# Patient Record
Sex: Male | Born: 1955 | Hispanic: No | Marital: Married | Smoking: Current every day smoker
Health system: Southern US, Community
[De-identification: ages and names within clinical notes are randomized; demographics above are authoritative.]

## PROBLEM LIST (undated history)

## (undated) DIAGNOSIS — M1990 Unspecified osteoarthritis, unspecified site: Secondary | ICD-10-CM

## (undated) DIAGNOSIS — Z5189 Encounter for other specified aftercare: Secondary | ICD-10-CM

## (undated) DIAGNOSIS — F32A Depression, unspecified: Secondary | ICD-10-CM

## (undated) DIAGNOSIS — D693 Immune thrombocytopenic purpura: Secondary | ICD-10-CM

## (undated) DIAGNOSIS — J45909 Unspecified asthma, uncomplicated: Secondary | ICD-10-CM

## (undated) DIAGNOSIS — F329 Major depressive disorder, single episode, unspecified: Secondary | ICD-10-CM

## (undated) DIAGNOSIS — IMO0002 Reserved for concepts with insufficient information to code with codable children: Secondary | ICD-10-CM

## (undated) DIAGNOSIS — I10 Essential (primary) hypertension: Secondary | ICD-10-CM

## (undated) DIAGNOSIS — E785 Hyperlipidemia, unspecified: Secondary | ICD-10-CM

## (undated) DIAGNOSIS — M797 Fibromyalgia: Secondary | ICD-10-CM

## (undated) DIAGNOSIS — K219 Gastro-esophageal reflux disease without esophagitis: Secondary | ICD-10-CM

## (undated) DIAGNOSIS — M329 Systemic lupus erythematosus, unspecified: Secondary | ICD-10-CM

## (undated) DIAGNOSIS — F419 Anxiety disorder, unspecified: Secondary | ICD-10-CM

## (undated) DIAGNOSIS — F909 Attention-deficit hyperactivity disorder, unspecified type: Secondary | ICD-10-CM

## (undated) HISTORY — DX: Depression, unspecified: F32.A

## (undated) HISTORY — DX: Gastro-esophageal reflux disease without esophagitis: K21.9

## (undated) HISTORY — PX: OTHER SURGICAL HISTORY: SHX169

## (undated) HISTORY — DX: Unspecified asthma, uncomplicated: J45.909

## (undated) HISTORY — DX: Hyperlipidemia, unspecified: E78.5

## (undated) HISTORY — DX: Anxiety disorder, unspecified: F41.9

## (undated) HISTORY — DX: Major depressive disorder, single episode, unspecified: F32.9

## (undated) HISTORY — DX: Encounter for other specified aftercare: Z51.89

---

## 1998-07-31 HISTORY — PX: COLONOSCOPY: SHX174

## 2013-09-21 ENCOUNTER — Emergency Department (HOSPITAL_COMMUNITY): Payer: Medicaid Other

## 2013-09-21 ENCOUNTER — Emergency Department (HOSPITAL_COMMUNITY)
Admission: EM | Admit: 2013-09-21 | Discharge: 2013-09-21 | Disposition: A | Discharge status: Home / Self Care | Payer: Medicaid Other | Attending: Emergency Medicine | Admitting: Emergency Medicine

## 2013-09-21 ENCOUNTER — Encounter (HOSPITAL_COMMUNITY): Payer: Self-pay | Admitting: Emergency Medicine

## 2013-09-21 DIAGNOSIS — Z862 Personal history of diseases of the blood and blood-forming organs and certain disorders involving the immune mechanism: Secondary | ICD-10-CM | POA: Insufficient documentation

## 2013-09-21 DIAGNOSIS — R042 Hemoptysis: Secondary | ICD-10-CM | POA: Insufficient documentation

## 2013-09-21 DIAGNOSIS — F17200 Nicotine dependence, unspecified, uncomplicated: Secondary | ICD-10-CM | POA: Insufficient documentation

## 2013-09-21 DIAGNOSIS — Z79899 Other long term (current) drug therapy: Secondary | ICD-10-CM | POA: Insufficient documentation

## 2013-09-21 DIAGNOSIS — I10 Essential (primary) hypertension: Secondary | ICD-10-CM | POA: Insufficient documentation

## 2013-09-21 DIAGNOSIS — M129 Arthropathy, unspecified: Secondary | ICD-10-CM | POA: Insufficient documentation

## 2013-09-21 DIAGNOSIS — IMO0002 Reserved for concepts with insufficient information to code with codable children: Secondary | ICD-10-CM | POA: Insufficient documentation

## 2013-09-21 DIAGNOSIS — M329 Systemic lupus erythematosus, unspecified: Secondary | ICD-10-CM | POA: Insufficient documentation

## 2013-09-21 DIAGNOSIS — Z7189 Other specified counseling: Secondary | ICD-10-CM | POA: Insufficient documentation

## 2013-09-21 DIAGNOSIS — F909 Attention-deficit hyperactivity disorder, unspecified type: Secondary | ICD-10-CM | POA: Insufficient documentation

## 2013-09-21 DIAGNOSIS — Y9289 Other specified places as the place of occurrence of the external cause: Secondary | ICD-10-CM | POA: Insufficient documentation

## 2013-09-21 DIAGNOSIS — W010XXA Fall on same level from slipping, tripping and stumbling without subsequent striking against object, initial encounter: Secondary | ICD-10-CM | POA: Insufficient documentation

## 2013-09-21 DIAGNOSIS — W19XXXA Unspecified fall, initial encounter: Secondary | ICD-10-CM

## 2013-09-21 DIAGNOSIS — Y9389 Activity, other specified: Secondary | ICD-10-CM | POA: Insufficient documentation

## 2013-09-21 HISTORY — DX: Systemic lupus erythematosus, unspecified: M32.9

## 2013-09-21 HISTORY — DX: Immune thrombocytopenic purpura: D69.3

## 2013-09-21 HISTORY — DX: Fibromyalgia: M79.7

## 2013-09-21 HISTORY — DX: Attention-deficit hyperactivity disorder, unspecified type: F90.9

## 2013-09-21 HISTORY — DX: Essential (primary) hypertension: I10

## 2013-09-21 HISTORY — DX: Unspecified osteoarthritis, unspecified site: M19.90

## 2013-09-21 HISTORY — DX: Reserved for concepts with insufficient information to code with codable children: IMO0002

## 2013-09-21 LAB — CBC WITH DIFFERENTIAL/PLATELET
Basophils Absolute: 0.1 10*3/uL (ref 0.0–0.1)
Basophils Relative: 1 % (ref 0–1)
EOS ABS: 0.3 10*3/uL (ref 0.0–0.7)
Eosinophils Relative: 2 % (ref 0–5)
HCT: 43.2 % (ref 39.0–52.0)
Hemoglobin: 15.4 g/dL (ref 13.0–17.0)
LYMPHS ABS: 1.7 10*3/uL (ref 0.7–4.0)
Lymphocytes Relative: 12 % (ref 12–46)
MCH: 29.9 pg (ref 26.0–34.0)
MCHC: 35.6 g/dL (ref 30.0–36.0)
MCV: 83.9 fL (ref 78.0–100.0)
MONO ABS: 1.5 10*3/uL — AB (ref 0.1–1.0)
MONOS PCT: 10 % (ref 3–12)
Neutro Abs: 10.5 10*3/uL — ABNORMAL HIGH (ref 1.7–7.7)
Neutrophils Relative %: 75 % (ref 43–77)
PLATELETS: 311 10*3/uL (ref 150–400)
RBC: 5.15 MIL/uL (ref 4.22–5.81)
RDW: 13.5 % (ref 11.5–15.5)
WBC: 14 10*3/uL — ABNORMAL HIGH (ref 4.0–10.5)

## 2013-09-21 MED ORDER — HYDROCODONE-ACETAMINOPHEN 5-325 MG PO TABS
2.0000 | ORAL_TABLET | Freq: Once | ORAL | Status: AC
  Administered 2013-09-21: 2 via ORAL
  Filled 2013-09-21: qty 2

## 2013-09-21 NOTE — ED Notes (Signed)
Patient transported to CT 

## 2013-09-21 NOTE — ED Notes (Signed)
Pt states he fell three times on the ice landing on his back  Pt states he has lupus and other problems  Pt states he is having pain in his back, groin, and down into his leg  Pt states for the past 2 days he has been coughing up blood  Pt states it is "clumps of blood"  Pt states the pain he is having is different than his normal pain

## 2013-09-21 NOTE — ED Provider Notes (Signed)
CSN: [id number]     Arrival date & time 09/21/13  1913 History   First MD Initiated Contact with Patient 09/21/13 2048     Chief Complaint  Patient presents with  . Fall  . coughing up blood   . Back Pain  . Groin Pain     (Consider location/radiation/quality/duration/timing/severity/associated sxs/prior Treatment) HPI Slipped and fell on ice 2 days ago. He reports having fallen 3 times. Landing on his buttocks. He complains of low back pain radiating to right mid thigh and right groin since event. He also reports coughing up green mucus with slight flecks of blood onset 2 days ago he denies shortness of breath. Denies chest pain. No abdominal pain. Pain is moderate. Not made better or worse by anything. Past Medical History  Diagnosis Date  . Lupus   . ITP (idiopathic thrombocytopenic purpura)   . Hypertension   . Fibromyalgia   . ADHD (attention deficit hyperactivity disorder)   . Arthritis    History reviewed. No pertinent past surgical history. Family History  Problem Relation Age of Onset  . Hypertension Other   . Diabetes Other   . Cancer Other   . CAD Other    History  Substance Use Topics  . Smoking status: Current Every Day Smoker    Types: Cigarettes  . Smokeless tobacco: Not on file  . Alcohol Use: No    Review of Systems  Constitutional: Negative.   HENT: Negative.   Respiratory: Positive for cough.        Hemoptysis  Cardiovascular: Negative.   Gastrointestinal: Negative.   Musculoskeletal: Positive for back pain.  Skin: Negative.   Neurological: Negative.   Psychiatric/Behavioral: Negative.   All other systems reviewed and are negative.      Allergies  Dilaudid  Home Medications   Current Outpatient Rx  Name  Route  Sig  Dispense  Refill  . albuterol (PROVENTIL HFA;VENTOLIN HFA) 108 (90 BASE) MCG/ACT inhaler   Inhalation   Inhale 2 puffs into the lungs 3 (three) times daily.         Marland Kitchen amLODipine (NORVASC) 10 MG tablet   Oral   Take  10 mg by mouth daily.         Marland Kitchen amphetamine-dextroamphetamine (ADDERALL) 15 MG tablet   Oral   Take 15 mg by mouth 3 (three) times daily.         . beclomethasone (QVAR) 40 MCG/ACT inhaler   Inhalation   Inhale 1 puff into the lungs 2 (two) times daily.         . busPIRone (BUSPAR) 15 MG tablet   Oral   Take 15 mg by mouth 2 (two) times daily.         . cloNIDine (CATAPRES) 0.1 MG tablet   Oral   Take 0.1 mg by mouth 3 (three) times daily.         . hydrochlorothiazide (MICROZIDE) 12.5 MG capsule   Oral   Take 12.525 mg by mouth daily.         Marland Kitchen HYDROcodone-acetaminophen (NORCO/VICODIN) 5-325 MG per tablet   Oral   Take 1 tablet by mouth every 6 (six) hours as needed for moderate pain (pain).         Marland Kitchen levothyroxine (SYNTHROID, LEVOTHROID) 50 MCG tablet   Oral   Take 50 mcg by mouth at bedtime.         . lovastatin (MEVACOR) 20 MG tablet   Oral   Take 20 mg by mouth  at bedtime.         . meloxicam (MOBIC) 15 MG tablet   Oral   Take 15 mg by mouth daily.         . montelukast (SINGULAIR) 10 MG tablet   Oral   Take 10 mg by mouth at bedtime.          BP 163/97  Pulse 110  Temp(Src) 98.2 F (36.8 C) (Oral)  Resp 19  Ht 5\' 11"  (1.803 m)  Wt 202 lb (91.627 kg)  BMI 28.19 kg/m2  SpO2 100% Physical Exam  Nursing note and vitals reviewed. Constitutional: He is oriented to person, place, and time. He appears well-developed and well-nourished.  HENT:  Head: Normocephalic and atraumatic.  Eyes: Conjunctivae are normal. Pupils are equal, round, and reactive to light.  Neck: Neck supple. No tracheal deviation present. No thyromegaly present.  Cardiovascular: Normal rate and regular rhythm.   No murmur heard. Pulmonary/Chest: Effort normal and breath sounds normal.  Abdominal: Soft. Bowel sounds are normal. He exhibits no distension. There is no tenderness.  Musculoskeletal: Normal range of motion. He exhibits no edema and no tenderness.   Minimally tender over lumbar spine thoracic spine and cervical spine nontender pelvis stable   Neurological: He is alert and oriented to person, place, and time. Coordination normal.  Gait normal  Skin: Skin is warm and dry. No rash noted.  Psychiatric: He has a normal mood and affect.    ED Course  Procedures (including critical care time) Labs Review Labs Reviewed  CBC WITH DIFFERENTIAL - Abnormal; Notable for the following:    WBC 14.0 (*)    Neutro Abs 10.5 (*)    Monocytes Absolute 1.5 (*)    All other components within normal limits   Imaging Review Dg Chest 2 View (if Patient Has Fever And/or Copd)  09/21/2013   CLINICAL DATA:  Status post fall; hemoptysis. Lower back pain and groin pain. History of smoking.  EXAM: CHEST  2 VIEW  COMPARISON:  None.  FINDINGS: The lungs are well-aerated and clear. There is no evidence of focal opacification, pleural effusion or pneumothorax.  The heart is normal in size; the mediastinal contour is within normal limits. No acute osseous abnormalities are seen.  IMPRESSION: No acute cardiopulmonary process seen.   Electronically Signed   By: Roanna Raider M.D.   On: 09/21/2013 21:24    EKG Interpretation   None      All x-rays viewed by me. Immediately prior to discharge Patient felt improved after treatment with Norco. Results for orders placed during the hospital encounter of 09/21/13  CBC WITH DIFFERENTIAL      Result Value Ref Range   WBC 14.0 (*) 4.0 - 10.5 K/uL   RBC 5.15  4.22 - 5.81 MIL/uL   Hemoglobin 15.4  13.0 - 17.0 g/dL   HCT 78.2  95.6 - 21.3 %   MCV 83.9  78.0 - 100.0 fL   MCH 29.9  26.0 - 34.0 pg   MCHC 35.6  30.0 - 36.0 g/dL   RDW 08.6  57.8 - 46.9 %   Platelets 311  150 - 400 K/uL   Neutrophils Relative % 75  43 - 77 %   Neutro Abs 10.5 (*) 1.7 - 7.7 K/uL   Lymphocytes Relative 12  12 - 46 %   Lymphs Abs 1.7  0.7 - 4.0 K/uL   Monocytes Relative 10  3 - 12 %   Monocytes Absolute 1.5 (*) 0.1 -  1.0 K/uL    Eosinophils Relative 2  0 - 5 %   Eosinophils Absolute 0.3  0.0 - 0.7 K/uL   Basophils Relative 1  0 - 1 %   Basophils Absolute 0.1  0.0 - 0.1 K/uL   Dg Chest 2 View (if Patient Has Fever And/or Copd)  09/21/2013   CLINICAL DATA:  Status post fall; hemoptysis. Lower back pain and groin pain. History of smoking.  EXAM: CHEST  2 VIEW  COMPARISON:  None.  FINDINGS: The lungs are well-aerated and clear. There is no evidence of focal opacification, pleural effusion or pneumothorax.  The heart is normal in size; the mediastinal contour is within normal limits. No acute osseous abnormalities are seen.  IMPRESSION: No acute cardiopulmonary process seen.   Electronically Signed   By: Roanna RaiderJeffery  Chang M.D.   On: 09/21/2013 21:24   Dg Lumbar Spine Complete  09/21/2013   CLINICAL DATA:  Status post fall; lower back pain.  EXAM: LUMBAR SPINE - COMPLETE 4+ VIEW  COMPARISON:  None.  FINDINGS: There is no evidence of fracture or subluxation. Vertebral bodies demonstrate normal height and alignment. Intervertebral disc spaces are preserved. The visualized neural foramina are grossly unremarkable in appearance.  The visualized bowel gas pattern is unremarkable in appearance; air and stool are noted within the colon. The sacroiliac joints are within normal limits.  IMPRESSION: No evidence of fracture or subluxation along the lumbar spine.   Electronically Signed   By: Roanna RaiderJeffery  Chang M.D.   On: 09/21/2013 22:24   Dg Pelvis 1-2 Views  09/21/2013   CLINICAL DATA:  Status post fall; pelvic pain.  EXAM: PELVIS - 1-2 VIEW  COMPARISON:  None.  FINDINGS: There is no evidence of fracture or dislocation. Both femoral heads are seated normally within their respective acetabula. No significant degenerative change is appreciated. The sacroiliac joints are unremarkable in appearance.  The visualized bowel gas pattern is grossly unremarkable in appearance.  IMPRESSION: No evidence of fracture or dislocation.   Electronically Signed   By:  Roanna RaiderJeffery  Chang M.D.   On: 09/21/2013 22:17    MDM   Final diagnoses:  None   patient is under a pain contract with his primary care physician. I instructed him to call Dr.Hasan to adjust his pain medication regimen as needed. I spent 5 minutes counseling patient on smoking cessation. Patient felt to have bronchitis. No inhalers prescribed she denies shortness of breath and had clear lung exam. Diagnosis #1 fall #2 low back pain #3 bronchitis #4 tobacco abuse      Doug SouSam Kalani Baray, MD 09/21/13 2340

## 2013-12-23 ENCOUNTER — Emergency Department (HOSPITAL_COMMUNITY)
Admission: EM | Admit: 2013-12-23 | Discharge: 2013-12-23 | Disposition: A | Discharge status: Home / Self Care | Payer: Medicaid Other | Attending: Emergency Medicine | Admitting: Emergency Medicine

## 2013-12-23 ENCOUNTER — Encounter (HOSPITAL_COMMUNITY): Payer: Self-pay | Admitting: Emergency Medicine

## 2013-12-23 DIAGNOSIS — Z862 Personal history of diseases of the blood and blood-forming organs and certain disorders involving the immune mechanism: Secondary | ICD-10-CM | POA: Insufficient documentation

## 2013-12-23 DIAGNOSIS — Z79899 Other long term (current) drug therapy: Secondary | ICD-10-CM | POA: Insufficient documentation

## 2013-12-23 DIAGNOSIS — R21 Rash and other nonspecific skin eruption: Secondary | ICD-10-CM | POA: Insufficient documentation

## 2013-12-23 DIAGNOSIS — M329 Systemic lupus erythematosus, unspecified: Secondary | ICD-10-CM | POA: Insufficient documentation

## 2013-12-23 DIAGNOSIS — Z791 Long term (current) use of non-steroidal anti-inflammatories (NSAID): Secondary | ICD-10-CM | POA: Insufficient documentation

## 2013-12-23 DIAGNOSIS — IMO0002 Reserved for concepts with insufficient information to code with codable children: Secondary | ICD-10-CM | POA: Insufficient documentation

## 2013-12-23 DIAGNOSIS — M549 Dorsalgia, unspecified: Secondary | ICD-10-CM | POA: Insufficient documentation

## 2013-12-23 DIAGNOSIS — I10 Essential (primary) hypertension: Secondary | ICD-10-CM | POA: Insufficient documentation

## 2013-12-23 DIAGNOSIS — F909 Attention-deficit hyperactivity disorder, unspecified type: Secondary | ICD-10-CM | POA: Insufficient documentation

## 2013-12-23 DIAGNOSIS — R233 Spontaneous ecchymoses: Secondary | ICD-10-CM | POA: Insufficient documentation

## 2013-12-23 DIAGNOSIS — F17200 Nicotine dependence, unspecified, uncomplicated: Secondary | ICD-10-CM | POA: Insufficient documentation

## 2013-12-23 DIAGNOSIS — IMO0001 Reserved for inherently not codable concepts without codable children: Secondary | ICD-10-CM | POA: Insufficient documentation

## 2013-12-23 DIAGNOSIS — M129 Arthropathy, unspecified: Secondary | ICD-10-CM | POA: Insufficient documentation

## 2013-12-23 DIAGNOSIS — G8929 Other chronic pain: Secondary | ICD-10-CM | POA: Insufficient documentation

## 2013-12-23 LAB — CBC WITH DIFFERENTIAL/PLATELET
BASOS ABS: 0 10*3/uL (ref 0.0–0.1)
Basophils Relative: 1 % (ref 0–1)
EOS ABS: 0.2 10*3/uL (ref 0.0–0.7)
EOS PCT: 4 % (ref 0–5)
HCT: 37 % — ABNORMAL LOW (ref 39.0–52.0)
Hemoglobin: 13.3 g/dL (ref 13.0–17.0)
Lymphocytes Relative: 17 % (ref 12–46)
Lymphs Abs: 1 10*3/uL (ref 0.7–4.0)
MCH: 30.5 pg (ref 26.0–34.0)
MCHC: 35.9 g/dL (ref 30.0–36.0)
MCV: 84.9 fL (ref 78.0–100.0)
Monocytes Absolute: 0.7 10*3/uL (ref 0.1–1.0)
Monocytes Relative: 11 % (ref 3–12)
Neutro Abs: 4 10*3/uL (ref 1.7–7.7)
Neutrophils Relative %: 67 % (ref 43–77)
Platelets: 255 10*3/uL (ref 150–400)
RBC: 4.36 MIL/uL (ref 4.22–5.81)
RDW: 14.4 % (ref 11.5–15.5)
WBC: 6 10*3/uL (ref 4.0–10.5)

## 2013-12-23 LAB — DIC (DISSEMINATED INTRAVASCULAR COAGULATION) PANEL
APTT: 27 s (ref 24–37)
FIBRINOGEN: 277 mg/dL (ref 204–475)
INR: 0.97 (ref 0.00–1.49)
PLATELETS: 271 10*3/uL (ref 150–400)
SMEAR REVIEW: NONE SEEN

## 2013-12-23 LAB — COMPREHENSIVE METABOLIC PANEL
ALT: 44 U/L (ref 0–53)
AST: 28 U/L (ref 0–37)
Albumin: 4 g/dL (ref 3.5–5.2)
Alkaline Phosphatase: 63 U/L (ref 39–117)
BUN: 22 mg/dL (ref 6–23)
CALCIUM: 9 mg/dL (ref 8.4–10.5)
CO2: 20 mEq/L (ref 19–32)
Chloride: 105 mEq/L (ref 96–112)
Creatinine, Ser: 0.73 mg/dL (ref 0.50–1.35)
GFR calc Af Amer: >90 mL/min (ref >90)
GFR calc non Af Amer: >90 mL/min (ref >90)
Glucose, Bld: 115 mg/dL — ABNORMAL HIGH (ref 70–99)
Potassium: 3.8 mEq/L (ref 3.7–5.3)
SODIUM: 139 meq/L (ref 137–147)
TOTAL PROTEIN: 6.7 g/dL (ref 6.0–8.3)
Total Bilirubin: 0.3 mg/dL (ref 0.3–1.2)

## 2013-12-23 LAB — DIC (DISSEMINATED INTRAVASCULAR COAGULATION)PANEL
D-Dimer, Quant: <0.27 ug/mL-FEU (ref 0.00–0.48)
Prothrombin Time: 12.7 seconds (ref 11.6–15.2)

## 2013-12-23 MED ORDER — DOXYCYCLINE HYCLATE 100 MG PO CAPS: 100.0000 mg | ORAL_CAPSULE | Freq: Two times a day (BID) | ORAL | Status: DC

## 2013-12-23 MED ORDER — HYDROCODONE-ACETAMINOPHEN 5-325 MG PO TABS
2.0000 | ORAL_TABLET | Freq: Once | ORAL | Status: AC
  Administered 2013-12-23: 2 via ORAL
  Filled 2013-12-23: qty 2

## 2013-12-23 NOTE — Discharge Instructions (Signed)
There were no abnormalities on your blood work today. Your platelet count is normal and your coagulation studies are normal. We will give you doxycycline antibiotic to cover for possible [place] spotted fever which can cause a rash such as yours. Please take this medicaition as directed. Avoid direct sunlight as it can increase your chances of sunburn. Follow closely with your primary care doctor.   [place] Spotted Fever [place] Spotted Fever (RMSF) is the oldest known tick-borne disease of people in the [place]. This disease was named because it was first described among people in the [facility] [place] area who had an illness characterized by a rash with red-purple-black spots. This disease is caused by a rickettsia (Rickettsia rickettsii), a bacteria carried by the tick. [facility] wood tick and the American dog tick, acquire and transmit the RMSF bacteria (pictures NOT actual size). When a larval, nymphal or adult tick feeds on an infected rodent or larger animal, the tick can become infected. Infected adult ticks then feed on people who may then get RMSF. The tick transmits the disease to humans during a prolonged period of feeding that lasts many hours, days or even a couple weeks. The bite is painless and frequently goes unnoticed. An infected male tick may also pass the rickettsial bacteria to her eggs that then may mature to be infected adult ticks. The rickettsia that causes RMSF can also get into a person's body through damaged skin. A tick bite is not necessary. People can get RMSF if they crush a tick and get it's blood or body fluids on their skin through a small cut or sore.  DIAGNOSIS Diagnosis is made by laboratory tests.  TREATMENT Treatment is with antibiotics (medications that kill rickettsia and other bacteria). Immediate treatment usually prevents death. GEOGRAPHIC RANGE This disease was reported only in the [facility] [place] until 1931. RMSF has  more recently been described among individuals in all states except [place], [place] and [place]. The highest reported incidences of RMSF now occur among residents of [place], [place], [place] and [address] [place]. TIME OF YEAR  Most cases are diagnosed during late spring and summer when ticks are most active. However, especially in the warmer [place], a few cases occur during the winter. SYMPTOMS   Symptoms of RMSF begin from 2 to 14 days after a tick bite. The most common early symptoms are fever, muscle aches and headache followed by nausea (feeling sick to your stomach) or vomiting.  The RMSF rash is typically delayed until 3 or more days after symptom onset, and eventually develops in 9 of 10 infected patients by the 5th day of illness. If the disease is not treated it can cause death. If you get a fever, headache, muscle aches, rash, nausea or vomiting within 2 weeks of a possible tick bite or exposure you should see your caregiver immediately. PREVENTION Ticks prefer to hide in shady, moist ground litter. They can often be found above the ground clinging to tall grass, brush, shrubs and low tree branches. They also inhabit lawns and gardens, especially at the edges of woodlands and around old stone walls. Within the areas where ticks generally live, no naturally vegetated area can be considered completely free of infected ticks. The best precaution against RMSF is to avoid contact with soil, leaf litter and vegetation as much as possible in tick infested areas. For those who enjoy gardening or walking in their yards, clear brush and mow tall grass around houses and at the  edges of gardens. This may help reduce the tick population in the immediate area. Applications of chemical insecticides by a licensed professional in the spring (late May) and Fall (September) will also control ticks, especially in heavily infested areas. Treatment will never get rid of all the ticks. Getting rid of small  animal populations that host ticks will also decrease the tick population. When working in the garden, [employer], or handling soil and vegetation, wear light-colored protective clothing and gloves. Spot-check often to prevent ticks from reaching the skin. Ticks cannot jump or fly. They will not drop from an above-ground perch onto a passing animal. Once a tick gains access to human skin it climbs upward until it reaches a more protected area. For example, the back of the knee, groin, navel, armpit, ears or nape of the neck. It then begins the slow process of embedding itself in the skin. Campers, hikers, field workers, and others who spend time in wooded, brushy or tall grassy areas can avoid exposure to ticks by using the following precautions:  Wear light-colored clothing with a tight weave to spot ticks more easily and prevent contact with the skin.  Wear long pants tucked into socks, long-sleeved shirts tucked into pants and enclosed shoes or boots along with insect repellent.  Spray clothes with insect repellent containing either DEET or Permethrin. Only DEET can be used on exposed skin. Follow the manufacturer's directions carefully.  Wear a hat and keep long hair pulled back.  Stay on cleared, well-worn trails whenever possible.  Spot-check yourself and others often for the presence of ticks on clothes. If you find one, there are likely to be others. Check thoroughly.  Remove clothes after leaving tick-infested areas. If possible, wash them to eliminate any unseen ticks. Check yourself, your children and any pets from head to toe for the presence of ticks.  Shower and shampoo. You can greatly reduce your chances of contracting RMSF if you remove attached ticks as soon as possible. Regular checks of the body, including all body sites covered by hair (head, armpits, genitals), allow removal of the tick before rickettsial transmission. To remove an attached tick, use a forceps or tweezers  to detach the intact tick without leaving mouth parts in the skin. The tick bite wound should be cleansed after tick removal. Remember the most common symptoms of RMSF are fever, muscle aches, headache and nausea or vomiting with a later onset of rash. If you get these symptoms after a tick bite and while living in an area where RMSF is found, RMSF should be suspected. If the disease is not treated, it can cause death. See your caregiver immediately if you get these symptoms. Do this even if not aware of a tick bite. Document Released: 10/29/2000 Document Revised: 10/09/2011 Document Reviewed: 06/21/2009 [facility] Patient Information 2014 [place], [place].

## 2013-12-23 NOTE — ED Notes (Signed)
Pt alert, arrives from home, c/o bruising to lower legs, per family pt has hx of ITP, resp even unlabored

## 2013-12-23 NOTE — ED Provider Notes (Signed)
CSN: [id number]     Arrival date & time 12/23/13  1247 History   First MD Initiated Contact with Patient 12/23/13 1304     Chief Complaint  Patient presents with  . Bleeding/Bruising     (Consider location/radiation/quality/duration/timing/severity/associated sxs/prior Treatment) HPI  Patient presents with cc of petechiae. History provided by the patient and his wife. He states that he has a hx of lupus, chronic pain, and ITP. Patient states that he had an episode of severe ITP/Thrombocytopenia and what sound like possible DIC requiring ICU admission and multiple blood transfusions several years ago. [facility] [place]. Patient c/o petechial rash of the legs that developed yesterday and has worsened.He states that the physicians at the last admission told him to go to the ED immediately if this occurred. He also c/o abdominal bloating.He states that he had abdominal bleeding last time and was afraid it was returning. He denies abdominal pain, nausea, vomiting, or diarrhea.  He denies noticing prolonged bleeding times or other bleeding abnormalities such as bleeding gums, melena, or hematochezia.Marland Kitchen. He complains of pain all over which is chronic. He is seen by Dr. Quitman LivingsSami Hassan and is under pain contract.He denies any known tick bites or fever.   Past Medical History  Diagnosis Date  . Lupus   . ITP (idiopathic thrombocytopenic purpura)   . Hypertension   . Fibromyalgia   . ADHD (attention deficit hyperactivity disorder)   . Arthritis    History reviewed. No pertinent past surgical history. Family History  Problem Relation Age of Onset  . Hypertension Other   . Diabetes Other   . Cancer Other   . CAD Other    History  Substance Use Topics  . Smoking status: Current Every Day Smoker    Types: Cigarettes  . Smokeless tobacco: Not on file  . Alcohol Use: No    Review of Systems  Constitutional: Negative for fever and chills.  Respiratory: Negative  for cough and shortness of breath.   Cardiovascular: Negative for chest pain and palpitations.  Gastrointestinal: Negative for vomiting, abdominal pain, diarrhea and constipation.  Genitourinary: Negative for dysuria, urgency and frequency.  Musculoskeletal: Positive for arthralgias, back pain and myalgias.  Skin: Positive for rash.  Neurological: Negative for headaches.  Hematological: Negative for adenopathy. Bruises/bleeds easily.  Psychiatric/Behavioral: Positive for decreased concentration.  All other systems reviewed and are negative.     Allergies  Dilaudid and Prednisone  Home Medications   Prior to Admission medications   Medication Sig Start Date End Date Taking? Authorizing Provider  albuterol (PROVENTIL HFA;VENTOLIN HFA) 108 (90 BASE) MCG/ACT inhaler Inhale 2 puffs into the lungs 3 (three) times daily.   Yes Historical Provider, MD  amLODipine (NORVASC) 10 MG tablet Take 10 mg by mouth every morning.    Yes Historical Provider, MD  amphetamine-dextroamphetamine (ADDERALL) 15 MG tablet Take 15 mg by mouth 3 (three) times daily.   Yes Historical Provider, MD  beclomethasone (QVAR) 40 MCG/ACT inhaler Inhale 1 puff into the lungs 2 (two) times daily.   Yes Historical Provider, MD  busPIRone (BUSPAR) 15 MG tablet Take 15 mg by mouth 2 (two) times daily.   Yes Historical Provider, MD  cloNIDine (CATAPRES) 0.1 MG tablet Take 0.1 mg by mouth 3 (three) times daily.   Yes Historical Provider, MD  hydrochlorothiazide (MICROZIDE) 12.5 MG capsule Take 12.5 mg by mouth every morning.    Yes Historical Provider, MD  HYDROcodone-acetaminophen (NORCO/VICODIN) 5-325 MG per tablet Take 1  tablet by mouth every 6 (six) hours as needed for moderate pain.    Yes Historical Provider, MD  levothyroxine (SYNTHROID, LEVOTHROID) 50 MCG tablet Take 50 mcg by mouth at bedtime.   Yes Historical Provider, MD  lovastatin (MEVACOR) 20 MG tablet Take 20 mg by mouth at bedtime.   Yes Historical Provider, MD    meloxicam (MOBIC) 15 MG tablet Take 15 mg by mouth every morning.    Yes Historical Provider, MD  montelukast (SINGULAIR) 10 MG tablet Take 10 mg by mouth at bedtime.   Yes Historical Provider, MD   BP 152/91  Pulse 100  Temp(Src) 98 F (36.7 C) (Oral)  Resp 16  Wt 202 lb (91.627 kg)  SpO2 99% Physical Exam  Nursing note and vitals reviewed. Constitutional: He appears well-developed and well-nourished. No distress.  HENT:  Head: Normocephalic and atraumatic.  Mouth/Throat: Uvula is midline.    Eyes: Conjunctivae are normal. No scleral icterus.  Neck: Normal range of motion. Neck supple.  Cardiovascular: Normal rate, regular rhythm and normal heart sounds.   Pulmonary/Chest: Effort normal and breath sounds normal. No respiratory distress.  Abdominal: Soft. There is no tenderness.  Musculoskeletal: He exhibits no edema.  Neurological: He is alert.  Skin: Skin is warm and dry. He is not diaphoretic.     Psychiatric: His behavior is normal.    ED Course  Procedures (including critical care time) Labs Review Labs Reviewed  CBC WITH DIFFERENTIAL - Abnormal; Notable for the following:    HCT 37.0 (*)    All other components within normal limits  COMPREHENSIVE METABOLIC PANEL - Abnormal; Notable for the following:    Glucose, Bld 115 (*)    All other components within normal limits  DIC (DISSEMINATED INTRAVASCULAR COAGULATION) PANEL    Imaging Review No results found.   EKG Interpretation None      MDM   Final diagnoses:  Petechial rash   Concern for thrombocytopenia. DIC panel pending. Norco given for chronic pain.  Filed Vitals:   12/23/13 1300 12/23/13 1442  BP: 152/91 142/89  Pulse: 100 72  Temp: 98 F (36.7 C) 98.5 F (36.9 C)  TempSrc: Oral Oral  Resp: 16 20  Weight: 202 lb (91.627 kg)   SpO2: 99% 100%    VSS. Normal hgb. DIC panel including PT/INR, aPTT,fibrinogen and D-dimer are all wnl. No schisotcytes indicating microangiopathy  indicated.  Although he denies fever, I have discussed the case with my attending physician Dr. Gwendolyn Grant and will treat for possible RMSF with 14 days Doxy BID. Patient / Family / Caregiver informed of clinical course and work up findings to include any labs, imagine, or medical decision-making performed and agree with plan for discharge. Discussed return precautions using teach back method.   I personally reviewed the imaging tests through PACS system. I have reviewed and interpreted Lab values. I reviewed available ER/hospitalization records through the EMR    Arthor Captain, [place] 12/25/13 1040

## 2013-12-26 NOTE — ED Provider Notes (Signed)
Medical screening examination/treatment/procedure(s) were conducted as a shared visit with non-physician practitioner(s) and myself.  I personally evaluated the patient during the encounter.   EKG Interpretation None      Patient here with bruising. Hx of ITP. Mild lower leg petechiae. No thrombocytopenia. No signs of DIC. Will treat with Doxycycline for possible RMSF.  Dagmar Hait, MD 12/26/13 [phone]

## 2017-06-15 ENCOUNTER — Encounter: Payer: Self-pay | Admitting: Gastroenterology

## 2017-06-29 ENCOUNTER — Ambulatory Visit (AMBULATORY_SURGERY_CENTER): Payer: Self-pay | Admitting: *Deleted

## 2017-06-29 ENCOUNTER — Other Ambulatory Visit: Payer: Self-pay

## 2017-06-29 VITALS — Ht 71.0 in | Wt 196.0 lb

## 2017-06-29 DIAGNOSIS — Z1211 Encounter for screening for malignant neoplasm of colon: Secondary | ICD-10-CM

## 2017-06-29 MED ORDER — NA SULFATE-K SULFATE-MG SULF 17.5-3.13-1.6 GM/177ML PO SOLN: 1.0000 | Freq: Once | ORAL | 0 refills | Status: AC

## 2017-06-29 NOTE — Progress Notes (Signed)
No egg or soy allergy known to patient  No issues with past sedation with any surgeries  or procedures, no intubation problems  No diet pills per patient No home 02 use per patient  No blood thinners per patient  Pt denies issues with constipation  No A fib or A flutter  EMMI video sent to pt's e mail pt declined   

## 2017-07-10 ENCOUNTER — Encounter: Payer: Medicaid Other | Admitting: Gastroenterology

## 2017-07-16 ENCOUNTER — Ambulatory Visit (AMBULATORY_SURGERY_CENTER): Payer: Medicaid Other | Admitting: Gastroenterology

## 2017-07-16 ENCOUNTER — Encounter: Payer: Self-pay | Admitting: Gastroenterology

## 2017-07-16 VITALS — BP 124/75 | HR 71 | Temp 98.2°F | Resp 26 | Ht 71.0 in | Wt 196.0 lb

## 2017-07-16 DIAGNOSIS — Z1212 Encounter for screening for malignant neoplasm of rectum: Secondary | ICD-10-CM | POA: Diagnosis not present

## 2017-07-16 DIAGNOSIS — Z1211 Encounter for screening for malignant neoplasm of colon: Secondary | ICD-10-CM

## 2017-07-16 MED ORDER — SODIUM CHLORIDE 0.9 % IV SOLN: 500.0000 mL | INTRAVENOUS | Status: AC

## 2017-07-16 NOTE — Progress Notes (Signed)
Report given to PACU, vss 

## 2017-07-16 NOTE — Patient Instructions (Signed)
YOU HAD AN ENDOSCOPIC PROCEDURE TODAY AT THE [facility]:   Refer to the procedure report that was given to you for any specific questions about what was found during the examination.  If the procedure report does not answer your questions, please call your gastroenterologist to clarify.  If you requested that your care partner not be given the details of your procedure findings, then the procedure report has been included in a sealed envelope for you to review at your convenience later.  YOU SHOULD EXPECT: Some feelings of bloating in the abdomen. Passage of more gas than usual.  Walking can help get rid of the air that was put into your GI tract during the procedure and reduce the bloating. If you had a lower endoscopy (such as a colonoscopy or flexible sigmoidoscopy) you may notice spotting of blood in your stool or on the toilet paper. If you underwent a bowel prep for your procedure, you may not have a normal bowel movement for a few days.  Please Note:  You might notice some irritation and congestion in your nose or some drainage.  This is from the oxygen used during your procedure.  There is no need for concern and it should clear up in a day or so.  SYMPTOMS TO REPORT IMMEDIATELY:   Following lower endoscopy (colonoscopy or flexible sigmoidoscopy):  Excessive amounts of blood in the stool  Significant tenderness or worsening of abdominal pains  Swelling of the abdomen that is new, acute  Fever of 100F or higher  Fever of 100F or higher  Black, tarry-looking stools  For urgent or emergent issues, a gastroenterologist can be reached at any hour by calling [phone].   DIET:  We do recommend a small meal at first, but then you may proceed to your regular diet.  Drink plenty of fluids but you should avoid alcoholic beverages for 24 hours.  ACTIVITY:  You should plan to take it easy for the rest of today and you should NOT DRIVE or use heavy machinery until tomorrow  (because of the sedation medicines used during the test).    FOLLOW UP: Our staff will call the number listed on your records the next business day following your procedure to check on you and address any questions or concerns that you may have regarding the information given to you following your procedure. If we do not reach you, we will leave a message.  However, if you are feeling well and you are not experiencing any problems, there is no need to return our call.  We will assume that you have returned to your regular daily activities without incident.  If any biopsies were taken you will be contacted by phone or by letter within the next 1-3 weeks.  Please call us at [phone] if you have not heard about the biopsies in 3 weeks.    SIGNATURES/CONFIDENTIALITY: You and/or your care partner have signed paperwork which will be entered into your electronic medical record.  These signatures attest to the fact that that the information above on your After Visit Summary has been reviewed and is understood.  Full responsibility of the confidentiality of this discharge information lies with you and/or your care-partner.   Thank you for letting us take care of your health care needs today.

## 2017-07-16 NOTE — Op Note (Signed)
[facility] Patient Name: Garrett Thomas Procedure Date: 07/16/2017 8:26 AM MRN: [id number] Endoscopist: Sherilyn Cooter L. Myrtie Neither , MD Age: 61 Referring MD:  Date of Birth: 10-28-1955 Gender: Male Account #: [id number] Procedure:                Colonoscopy Indications:              Screening for colorectal malignant neoplasm, This                            is the patient's first screening colonoscopy Medicines:                Monitored Anesthesia Care Procedure:                Pre-Anesthesia Assessment:                           - Prior to the procedure, a History and Physical                            was performed, and patient medications and                            allergies were reviewed. The patient's tolerance of                            previous anesthesia was also reviewed. The risks                            and benefits of the procedure and the sedation                            options and risks were discussed with the patient.                            All questions were answered, and informed consent                            was obtained. Prior Anticoagulants: The patient has                            taken no previous anticoagulant or antiplatelet                            agents. ASA Grade Assessment: III - A patient with                            severe systemic disease. After reviewing the risks                            and benefits, the patient was deemed in                            satisfactory condition to undergo the procedure.  After obtaining informed consent, the colonoscope                            was passed under direct vision. Throughout the                            procedure, the patient's blood pressure, pulse, and                            oxygen saturations were monitored continuously. The                            Colonoscope was introduced through the anus and                            advanced to the  the cecum, identified by                            appendiceal orifice and ileocecal valve. The                            colonoscopy was performed without difficulty. The                            patient tolerated the procedure well. The quality                            of the bowel preparation was fair. The ileocecal                            valve, appendiceal orifice, and rectum were                            photographed. The quality of the bowel preparation                            was evaluated using the BBPS [facility] Bowel                            Preparation Scale) with scores of: Right Colon = 1,                            Transverse Colon = 2 and Left Colon = 1. The total                            BBPS score equals 4. After lavage. The bowel                            preparation used was SUPREP. Scope In: 8:32:26 AM Scope Out: 8:53:52 AM Scope Withdrawal Time: 0 hours 16 minutes 32 seconds  Total Procedure Duration: 0 hours 21 minutes 26 seconds  Findings:                 The digital rectal  exam findings include enlarged                            prostate.                           The entire examined colon appeared normal on direct                            and retroflexion views.                           A large amount of semi-liquid stool was found in                            the entire colon, interfering with visualization.                            Extensive lavage was performed. Complications:            No immediate complications. Estimated Blood Loss:     Estimated blood loss: none. Impression:               - Preparation of the colon was fair.                           - Enlarged prostate found on digital rectal exam.                           - The entire examined colon is normal on direct and                            retroflexion views.                           - Stool in the entire examined colon.                           - No specimens  collected. Recommendation:           - Patient has a contact number available for                            emergencies. The signs and symptoms of potential                            delayed complications were discussed with the                            patient. Return to normal activities tomorrow.                            Written discharge instructions were provided to the                            patient.                           -  Resume previous diet.                           - Continue present medications.                           - Repeat colonoscopy in 1 year for screening                            purposes due to poor preparation. Use 2-day bowel                            preparation. Waylen Depaolo L. Myrtie Neitheranis, MD 07/16/2017 8:58:21 AM This report has been signed electronically.

## 2017-07-16 NOTE — Progress Notes (Signed)
Pt verbalize he is is often pulling out long worms from the rectum, he has brought them in and given to his pcp. Verbalize when he is pulling them out they try to go back in. Verbalize the head of the worm is silver in color.Pt verbalize it has been going on for 12 years now.has he thinks it is connected to a finger that has not healed. The fingernail keeps coming off.

## 2017-07-17 ENCOUNTER — Telehealth: Payer: Self-pay | Admitting: *Deleted

## 2017-07-17 NOTE — Telephone Encounter (Signed)
No answer, message left for the patient. 

## 2018-02-07 ENCOUNTER — Other Ambulatory Visit: Payer: Self-pay

## 2018-02-07 ENCOUNTER — Emergency Department (HOSPITAL_COMMUNITY): Payer: Medicaid Other

## 2018-02-07 ENCOUNTER — Emergency Department (HOSPITAL_COMMUNITY)
Admission: EM | Admit: 2018-02-07 | Discharge: 2018-02-08 | Disposition: A | Discharge status: Home / Self Care | Payer: Medicaid Other | Attending: Emergency Medicine | Admitting: Emergency Medicine

## 2018-02-07 ENCOUNTER — Encounter (HOSPITAL_COMMUNITY): Payer: Self-pay | Admitting: Emergency Medicine

## 2018-02-07 DIAGNOSIS — Y929 Unspecified place or not applicable: Secondary | ICD-10-CM | POA: Insufficient documentation

## 2018-02-07 DIAGNOSIS — I10 Essential (primary) hypertension: Secondary | ICD-10-CM | POA: Insufficient documentation

## 2018-02-07 DIAGNOSIS — J45909 Unspecified asthma, uncomplicated: Secondary | ICD-10-CM | POA: Insufficient documentation

## 2018-02-07 DIAGNOSIS — S8992XA Unspecified injury of left lower leg, initial encounter: Secondary | ICD-10-CM | POA: Diagnosis not present

## 2018-02-07 DIAGNOSIS — Y939 Activity, unspecified: Secondary | ICD-10-CM | POA: Insufficient documentation

## 2018-02-07 DIAGNOSIS — Y999 Unspecified external cause status: Secondary | ICD-10-CM | POA: Diagnosis not present

## 2018-02-07 DIAGNOSIS — Z79899 Other long term (current) drug therapy: Secondary | ICD-10-CM | POA: Insufficient documentation

## 2018-02-07 DIAGNOSIS — W109XXA Fall (on) (from) unspecified stairs and steps, initial encounter: Secondary | ICD-10-CM | POA: Diagnosis not present

## 2018-02-07 DIAGNOSIS — F17210 Nicotine dependence, cigarettes, uncomplicated: Secondary | ICD-10-CM | POA: Insufficient documentation

## 2018-02-07 NOTE — ED Triage Notes (Addendum)
Pt from home with c/o left leg pain behind his knee. Pt states he was on a step and the step broke. Pt states he heard a pop in his knee and he is now unable to bend his knee. Pt is able to move his toes. Pt rates his pain 10/10. No obvious deformity or swelling noted

## 2018-02-08 MED ORDER — HYDROCODONE-ACETAMINOPHEN 5-325 MG PO TABS
2.0000 | ORAL_TABLET | Freq: Once | ORAL | Status: AC
  Administered 2018-02-08: 2 via ORAL
  Filled 2018-02-08: qty 2

## 2018-02-08 NOTE — Discharge Instructions (Addendum)
You have been seen today for a knee injury. There were no definite acute abnormalities on the x-rays, including no sign of fracture or dislocation, however, there could be injuries to the soft tissues, such as the ligaments or tendons that are not seen on xrays. There could also be what are called occult fractures that are small fractures not seen on xray. Pain: Take 600 mg of ibuprofen every 6 hours or 440 mg (over the counter dose) to 500 mg (prescription dose) of naproxen every 12 hours for the next 3 days. After this time, these medications may be used as needed for pain. Take these medications with food to avoid upset stomach. Choose only one of these medications, do not take them together.  Tylenol: Should you continue to have additional pain while taking the ibuprofen or naproxen, you may add in tylenol as needed. Your daily total maximum amount of tylenol from all sources should be limited to 4000mg /day for persons without liver problems, or 2000mg /day for those with liver problems. Vicodin: May take Vicodin as needed for severe pain.  Do not drive or perform other dangerous activities while taking the Vicodin.  Please note that each pill of Vicodin contains 325 mg of Tylenol and the above dosage limits apply. Ice: May apply ice to the area over the next 24 hours for 15 minutes at a time to reduce swelling. Elevation: Keep the extremity elevated as often as possible to reduce pain and inflammation. Support: Wear the knee immobilizer for support and comfort. Wear this until pain resolves. You will be weight-bearing as tolerated, which means you can slowly start to put weight on the extremity and increase amount and frequency as pain allows. Follow up: You should follow up with the orthopedic specialist within two weeks. Return: Return to the ED for numbness, weakness, increasing pain, overall worsening symptoms, loss of function, or if symptoms are not improving, you have tried to follow up with the  orthopedic specialist, and have been unable to do so.

## 2018-02-08 NOTE — ED Provider Notes (Signed)
[facility]-EMERGENCY DEPT Provider Note   CSN: [id number] Arrival date & time: 02/07/18  2153     History   Chief Complaint Chief Complaint  Patient presents with  . Leg Injury    HPI Garrett Thomas is a 61 y.o. male.  HPI   Garrett Thomas is a 62 y.o. male, with a history of HTN, GERD, fibromyalgia, arthritis, and lupus, presenting to the ED with left knee injury occurring around 5 pm 7/11.  States 1 of the porch steps gave way as he stepped on it.  Patient heard a pop in the back of the knee and had instant pain.  Pain is sharp, severe, nonradiating.  Pain is worse with flexion at the knee and ambulation.  Denies head injury, neck/back pain, numbness, weakness, or any other complaints.    Past Medical History:  Diagnosis Date  . ADHD (attention deficit hyperactivity disorder)   . Anxiety   . Arthritis   . Asthma   . Blood transfusion without reported diagnosis    x9  . Depression   . Fibromyalgia   . GERD (gastroesophageal reflux disease)   . Hyperlipidemia   . Hypertension   . ITP (idiopathic thrombocytopenic purpura)   . Lupus (HCC)     There are no active problems to display for this patient.   Past Surgical History:  Procedure Laterality Date  . COLONOSCOPY  2000   [facility] [place]- they cannot find a report on this per Pt   . dental surgeries          Home Medications    Prior to Admission medications   Medication Sig Start Date End Date Taking? Authorizing Provider  albuterol (PROVENTIL HFA;VENTOLIN HFA) 108 (90 BASE) MCG/ACT inhaler Inhale 2 puffs into the lungs 3 (three) times daily.    [provider]  amLODipine (NORVASC) 10 MG tablet Take 10 mg by mouth every morning.     [provider]  amphetamine-dextroamphetamine (ADDERALL) 20 MG tablet TAKE 1 TABLET BY MOUTH 3 TIMES A DAY FOR ADHD 06/01/17   [provider]  atenolol (TENORMIN) 25 MG tablet Take 25 mg by mouth daily.    [provider]  beclomethasone (QVAR) 40 MCG/ACT inhaler Inhale 1 puff into the lungs 2 (two) times daily.    [provider]  busPIRone (BUSPAR) 15 MG tablet Take 15 mg by mouth 2 (two) times daily.    [provider]  cloNIDine (CATAPRES) 0.1 MG tablet Take 0.1 mg by mouth 3 (three) times daily.    [provider]  cyclobenzaprine (FLEXERIL) 10 MG tablet Take 10 mg by mouth at bedtime as needed. 05/23/17   [provider]  doxycycline (VIBRAMYCIN) 100 MG capsule Take 1 capsule (100 mg total) by mouth 2 (two) times daily. One po bid x 7 days Patient not taking: Reported on 07/16/2017 12/23/13   Arthor Captain, PA-C  FLOVENT HFA 110 MCG/ACT inhaler 2 (two) times daily. as directed 05/23/17   [provider]  hydrochlorothiazide (MICROZIDE) 12.5 MG capsule Take 12.5 mg by mouth every morning.     [provider]  HYDROcodone-acetaminophen (NORCO) 10-325 MG tablet Take 1 tablet by mouth 3 (three) times daily as needed. 04/10/17   [provider]  HYDROcodone-acetaminophen (NORCO/VICODIN) 5-325 MG per tablet Take 1 tablet by mouth every 6 (six) hours as needed for moderate pain.     [provider]  levothyroxine (SYNTHROID, LEVOTHROID) 50 MCG tablet Take 50 mcg by  mouth at bedtime.    [provider]  lovastatin (MEVACOR) 20 MG tablet Take 20 mg by mouth at bedtime.    [provider]  meloxicam (MOBIC) 15 MG tablet Take 15 mg by mouth every morning.     [provider]  montelukast (SINGULAIR) 10 MG tablet Take 10 mg by mouth at bedtime.    [provider]  omeprazole (PRILOSEC) 20 MG capsule Take 20 mg by mouth daily. 05/23/17   [provider]  OVER THE COUNTER MEDICATION Nugentix 2 tabs daily    [provider]  sertraline (ZOLOFT) 100 MG tablet TAKE 1 TABLET BY MOUTH EVERY DAY AS NEEDED FOR ANXIETY /DEPRESSION 06/01/17   [provider]    Family History Family  History  Problem Relation Age of Onset  . Hypertension Other   . Diabetes Other   . Cancer Other   . CAD Other   . Colon polyps Brother   . Crohn's disease Other   . Colon cancer Neg Hx   . Esophageal cancer Neg Hx   . Rectal cancer Neg Hx   . Stomach cancer Neg Hx     Social History Social History   Tobacco Use  . Smoking status: Current Every Day Smoker    Packs/day: 1.00    Types: Cigarettes  . Smokeless tobacco: Never Used  Substance Use Topics  . Alcohol use: No  . Drug use: No     Allergies   Demerol [meperidine hcl]; Dilaudid [hydromorphone hcl]; and Prednisone   Review of Systems Review of Systems  Musculoskeletal: Positive for arthralgias.  Neurological: Negative for weakness and numbness.     Physical Exam Updated Vital Signs BP (!) 176/65 (BP Location: Left Arm)   Pulse (!) 110   Temp 98.3 F (36.8 C) (Oral)   Resp 20   SpO2 100%   Physical Exam  Constitutional: He appears well-developed and well-nourished. No distress.  HENT:  Head: Normocephalic and atraumatic.  Eyes: Conjunctivae are normal.  Neck: Neck supple.  Cardiovascular: Normal rate, regular rhythm and intact distal pulses.  Pulmonary/Chest: Effort normal.  Musculoskeletal: He exhibits tenderness. He exhibits no edema or deformity.  Some tenderness to the posterior left knee.  Some swelling noted.  No noted crepitus, deformity, or laxity. Patient has motor function intact in the left knee, however, flexion at the left knee is quite painful.  Neurological: He is alert.  Sensation to light touch grossly intact in the left lower extremity. Flexion and extension intact in the left knee, though patient has severe pain with attempts at ambulation or with flexion of the left knee.  Skin: Skin is warm and dry. He is not diaphoretic. No pallor.  Psychiatric: He has a normal mood and affect. His behavior is normal.  Nursing note and vitals reviewed.    ED Treatments / Results  Labs (all  labs ordered are listed, but only abnormal results are displayed) Labs Reviewed - No data to display  EKG None  Radiology   Dg Knee Complete 4 Views Left  Result Date: 02/07/2018 CLINICAL DATA:  Popliteal fossa pain today.  Fell through step. EXAM: LEFT KNEE - COMPLETE 4+ VIEW COMPARISON:  None. FINDINGS: Linear lucencies through tibial spines. No dislocation. Mild patellofemoral compartment narrowing and marginal spurring compatible with osteoarthrosis. No destructive bony lesions. Small suprapatellar joint effusion without subcutaneous gas or radiopaque foreign bodies. IMPRESSION: Lucency through tibial spine seen with ACL injury.  No dislocation. Mild patellofemoral compartment osteoarthrosis. Electronically Signed  By: Awilda Metroourtnay  Bloomer M.D.   On: 02/07/2018 22:30    Procedures Procedures (including critical care time)  Medications Ordered in ED Medications  HYDROcodone-acetaminophen (NORCO/VICODIN) 5-325 MG per tablet 2 tablet (2 tablets Oral Given 02/08/18 0142)     Initial Impression / Assessment and Plan / ED Course  I have reviewed the triage vital signs and the nursing notes.  Pertinent labs & imaging results that were available during my care of the patient were reviewed by me and considered in my medical decision making (see chart for details).    Patient presents with a left knee injury.  Small effusion was suspicion for ligamental injury.  Orthopedic follow-up.  Patient's family has an [facility] relationship [facility] and would like to stay with this practice. The patient was given instructions for home care as well as return precautions. Patient voices understanding of these instructions, accepts the plan, and is comfortable with discharge.  Search of [place] narcotic database reveals patient filled a prescription for 120 count hydrocodone-APAP 10-325 mg on July 9.  Final Clinical Impressions(s) / ED Diagnoses   Final diagnoses:  Injury of left knee,  initial encounter    ED Discharge Orders    None       Concepcion LivingJoy, Refugio Vandevoorde C, PA-C 02/10/18 1552    Molpus, John, MD 02/11/18 1356

## 2018-09-15 ENCOUNTER — Emergency Department (HOSPITAL_COMMUNITY)
Admission: EM | Admit: 2018-09-15 | Discharge: 2018-09-15 | Disposition: A | Discharge status: Home / Self Care | Payer: Medicaid Other | Attending: Emergency Medicine | Admitting: Emergency Medicine

## 2018-09-15 ENCOUNTER — Encounter (HOSPITAL_COMMUNITY): Payer: Self-pay | Admitting: Emergency Medicine

## 2018-09-15 ENCOUNTER — Other Ambulatory Visit: Payer: Self-pay

## 2018-09-15 DIAGNOSIS — I10 Essential (primary) hypertension: Secondary | ICD-10-CM | POA: Insufficient documentation

## 2018-09-15 DIAGNOSIS — Z79899 Other long term (current) drug therapy: Secondary | ICD-10-CM | POA: Insufficient documentation

## 2018-09-15 DIAGNOSIS — L089 Local infection of the skin and subcutaneous tissue, unspecified: Secondary | ICD-10-CM | POA: Diagnosis not present

## 2018-09-15 DIAGNOSIS — M79644 Pain in right finger(s): Secondary | ICD-10-CM | POA: Diagnosis present

## 2018-09-15 DIAGNOSIS — J45909 Unspecified asthma, uncomplicated: Secondary | ICD-10-CM | POA: Diagnosis not present

## 2018-09-15 DIAGNOSIS — F17210 Nicotine dependence, cigarettes, uncomplicated: Secondary | ICD-10-CM | POA: Insufficient documentation

## 2018-09-15 LAB — CBC WITH DIFFERENTIAL/PLATELET
ABS IMMATURE GRANULOCYTES: 0.05 10*3/uL (ref 0.00–0.07)
BASOS PCT: 1 %
Basophils Absolute: 0.1 10*3/uL (ref 0.0–0.1)
EOS PCT: 3 %
Eosinophils Absolute: 0.2 10*3/uL (ref 0.0–0.5)
HCT: 42.1 % (ref 39.0–52.0)
Hemoglobin: 14 g/dL (ref 13.0–17.0)
Immature Granulocytes: 1 %
Lymphocytes Relative: 23 %
Lymphs Abs: 2 10*3/uL (ref 0.7–4.0)
MCH: 29.8 pg (ref 26.0–34.0)
MCHC: 33.3 g/dL (ref 30.0–36.0)
MCV: 89.6 fL (ref 80.0–100.0)
MONO ABS: 0.9 10*3/uL (ref 0.1–1.0)
MONOS PCT: 11 %
NEUTROS ABS: 5.2 10*3/uL (ref 1.7–7.7)
Neutrophils Relative %: 61 %
PLATELETS: 275 10*3/uL (ref 150–400)
RBC: 4.7 MIL/uL (ref 4.22–5.81)
RDW: 14.5 % (ref 11.5–15.5)
WBC: 8.3 10*3/uL (ref 4.0–10.5)
nRBC: 0 % (ref 0.0–0.2)

## 2018-09-15 LAB — COMPREHENSIVE METABOLIC PANEL
ALBUMIN: 4.7 g/dL (ref 3.5–5.0)
ALT: 37 U/L (ref 0–44)
AST: 27 U/L (ref 15–41)
Alkaline Phosphatase: 66 U/L (ref 38–126)
Anion gap: 7 (ref 5–15)
BUN: 24 mg/dL — AB (ref 8–23)
CALCIUM: 8.9 mg/dL (ref 8.9–10.3)
CO2: 22 mmol/L (ref 22–32)
CREATININE: 0.86 mg/dL (ref 0.61–1.24)
Chloride: 109 mmol/L (ref 98–111)
GFR calc Af Amer: >60 mL/min (ref >60)
Glucose, Bld: 112 mg/dL — ABNORMAL HIGH (ref 70–99)
POTASSIUM: 4 mmol/L (ref 3.5–5.1)
Sodium: 138 mmol/L (ref 135–145)
TOTAL PROTEIN: 7.3 g/dL (ref 6.5–8.1)
Total Bilirubin: 0.5 mg/dL (ref 0.3–1.2)

## 2018-09-15 LAB — LACTIC ACID, PLASMA: LACTIC ACID, VENOUS: 1.8 mmol/L (ref 0.5–1.9)

## 2018-09-15 MED ORDER — SULFAMETHOXAZOLE-TRIMETHOPRIM 800-160 MG PO TABS: 1.0000 | ORAL_TABLET | Freq: Two times a day (BID) | ORAL | 0 refills | Status: DC

## 2018-09-15 MED ORDER — SODIUM CHLORIDE 0.9% FLUSH: 3.0000 mL | Freq: Once | INTRAVENOUS | Status: DC

## 2018-09-15 MED ORDER — SULFAMETHOXAZOLE-TRIMETHOPRIM 800-160 MG PO TABS
1.0000 | ORAL_TABLET | Freq: Once | ORAL | Status: AC
  Administered 2018-09-15: 1 via ORAL
  Filled 2018-09-15: qty 1

## 2018-09-15 NOTE — ED Triage Notes (Signed)
Patient is complaining of infection right hand middle, index, and thumb. Patient complains of pain medications. Patient think he is septic.

## 2018-09-15 NOTE — ED Provider Notes (Addendum)
[facility]-EMERGENCY DEPT Provider Note   CSN: [id number] Arrival date & time: 09/15/18  1927     History   Chief Complaint Chief Complaint  Patient presents with  . finger infection    HPI Garrett Thomas is a 63 y.o. male.  The history is provided by the patient. No language interpreter was used.  Hand Pain  This is a new problem. The problem occurs constantly. The problem has been gradually worsening. Nothing aggravates the symptoms. Nothing relieves the symptoms. He has tried nothing for the symptoms. The treatment provided no relief.  Pt complains of swelling and push to his finger tip.  Pt reports he has worms that grow out of his finger.  Pt reports he has to be fast to pull them out.  (Pt has a bag full of dried skin)  Pt describes picking and pulling at open wound.    Past Medical History:  Diagnosis Date  . ADHD (attention deficit hyperactivity disorder)   . Anxiety   . Arthritis   . Asthma   . Blood transfusion without reported diagnosis    x9  . Depression   . Fibromyalgia   . GERD (gastroesophageal reflux disease)   . Hyperlipidemia   . Hypertension   . ITP (idiopathic thrombocytopenic purpura)   . Lupus (HCC)     There are no active problems to display for this patient.   Past Surgical History:  Procedure Laterality Date  . COLONOSCOPY  2000   [facility] in [place]- they cannot find a report on this per Pt   . dental surgeries          Home Medications    Prior to Admission medications   Medication Sig Start Date End Date Taking? Authorizing Provider  albuterol (PROVENTIL HFA;VENTOLIN HFA) 108 (90 BASE) MCG/ACT inhaler Inhale 2 puffs into the lungs 3 (three) times daily.    [provider]  amLODipine (NORVASC) 10 MG tablet Take 10 mg by mouth every morning.     [provider]  amphetamine-dextroamphetamine (ADDERALL) 20 MG tablet TAKE 1 TABLET BY MOUTH 3 TIMES A DAY FOR ADHD 06/01/17   [provider]  atenolol (TENORMIN) 25 MG tablet Take 25 mg by mouth daily.    [provider]  beclomethasone (QVAR) 40 MCG/ACT inhaler Inhale 1 puff into the lungs 2 (two) times daily.    [provider]  busPIRone (BUSPAR) 15 MG tablet Take 15 mg by mouth 2 (two) times daily.    [provider]  cloNIDine (CATAPRES) 0.1 MG tablet Take 0.1 mg by mouth 3 (three) times daily.    [provider]  cyclobenzaprine (FLEXERIL) 10 MG tablet Take 10 mg by mouth at bedtime as needed. 05/23/17   [provider]  doxycycline (VIBRAMYCIN) 100 MG capsule Take 1 capsule (100 mg total) by mouth 2 (two) times daily. One po bid x 7 days Patient not taking: Reported on 07/16/2017 12/23/13   Arthor Captain, PA-C  FLOVENT HFA 110 MCG/ACT inhaler 2 (two) times daily. as directed 05/23/17   [provider]  hydrochlorothiazide (MICROZIDE) 12.5 MG capsule Take 12.5 mg by mouth every morning.     [provider]  HYDROcodone-acetaminophen (NORCO) 10-325 MG tablet Take 1 tablet by mouth 3 (three) times daily as needed. 04/10/17   [provider]  HYDROcodone-acetaminophen (NORCO/VICODIN) 5-325 MG per tablet Take 1 tablet by mouth every 6 (six) hours as needed for moderate pain.     [provider]  levothyroxine (SYNTHROID, LEVOTHROID) 50 MCG tablet Take 50 mcg by mouth at bedtime.    [provider]  lovastatin (MEVACOR) 20 MG tablet Take 20 mg by mouth at bedtime.    [provider]  meloxicam (MOBIC) 15 MG tablet Take 15 mg by mouth every morning.     [provider]  montelukast (SINGULAIR) 10 MG tablet Take 10 mg by mouth at bedtime.    [provider]  omeprazole (PRILOSEC) 20 MG capsule Take 20 mg by mouth daily. 05/23/17   [provider]  OVER THE COUNTER MEDICATION Nugentix 2 tabs daily    [provider]  sertraline (ZOLOFT) 100 MG tablet TAKE 1 TABLET BY MOUTH EVERY DAY AS  NEEDED FOR ANXIETY /DEPRESSION 06/01/17   [provider]    Family History Family History  Problem Relation Age of Onset  . Hypertension Other   . Diabetes Other   . Cancer Other   . CAD Other   . Colon polyps Brother   . Crohn's disease Other   . Colon cancer Neg Hx   . Esophageal cancer Neg Hx   . Rectal cancer Neg Hx   . Stomach cancer Neg Hx     Social History Social History   Tobacco Use  . Smoking status: Current Every Day Smoker    Packs/day: 1.00    Types: Cigarettes  . Smokeless tobacco: Never Used  Substance Use Topics  . Alcohol use: No  . Drug use: No     Allergies   Demerol [meperidine hcl]; Dilaudid [hydromorphone hcl]; and Prednisone   Review of Systems Review of Systems  All other systems reviewed and are negative.    Physical Exam Updated Vital Signs BP (!) 154/90 (BP Location: Left Arm)   Pulse 85   Temp 97.8 F (36.6 C) (Oral)   Resp 18   Ht 5\' 11"  (1.803 m)   Wt 92.5 kg   SpO2 98%   BMI 28.45 kg/m   Physical Exam Vitals signs and nursing note reviewed.  Constitutional:      Appearance: He is well-developed.  HENT:     Head: Normocephalic.  Neck:     Musculoskeletal: Normal range of motion.  Pulmonary:     Effort: Pulmonary effort is normal.  Abdominal:     General: There is no distension.  Musculoskeletal: Normal range of motion.     Comments: Red distal finger nail, slight swelling.  No obvious pus  Skin:    General: Skin is warm.     Findings: Erythema present.  Neurological:     Mental Status: He is alert and oriented to person, place, and time.      ED Treatments / Results  Labs (all labs ordered are listed, but only abnormal results are displayed) Labs Reviewed  COMPREHENSIVE METABOLIC PANEL - Abnormal; Notable for the following components:      Result Value   Glucose, Bld 112 (*)    BUN 24 (*)    All other components within normal limits  LACTIC ACID, PLASMA  CBC WITH DIFFERENTIAL/PLATELET    URINALYSIS, ROUTINE W REFLEX MICROSCOPIC    EKG None  Radiology No results found.  Procedures Procedures (including critical care time)  Medications Ordered in ED Medications  sodium chloride flush (NS) 0.9 % injection 3 mL (3 mLs Intravenous Not Given 09/15/18 2117)  sulfamethoxazole-trimethoprim (BACTRIM DS,SEPTRA DS) 800-160 MG per tablet 1 tablet (has no administration in time range)  Initial Impression / Assessment and Plan / ED Course  I have reviewed the triage vital signs and the nursing notes.  Pertinent labs & imaging results that were available during my care of the patient were reviewed by me and considered in my medical decision making (see chart for details).     I am concerned pt may have infection from chronically picking at finger.  Pt requested nurses do labs because he is worried about septic.  Pt advised labs are okay and he is not septic.  Pt advised leave finger alone and bandaged.   Final Clinical Impressions(s) / ED Diagnoses   Final diagnoses:  Skin infection    ED Discharge Orders    None    An After Visit Summary was printed and given to the patient. Scheduled Meds: . sodium chloride flush  3 mL Intravenous Once   Continuous Infusions: . sodium chloride    An After Visit Summary was printed and given to the patient.    Elson Areas, PA-C 09/15/18 2135    Elson Areas, PA-C 09/15/18 2135    Terrilee Files, MD 09/15/18 [phone]

## 2018-09-15 NOTE — Discharge Instructions (Addendum)
See your Physician for recheck.   Do not pick at wound.  Leave area alone

## 2018-09-21 ENCOUNTER — Other Ambulatory Visit: Payer: Self-pay

## 2018-09-21 ENCOUNTER — Encounter (HOSPITAL_COMMUNITY): Payer: Self-pay

## 2018-09-21 ENCOUNTER — Emergency Department (HOSPITAL_COMMUNITY): Payer: Medicaid Other

## 2018-09-21 ENCOUNTER — Emergency Department (HOSPITAL_COMMUNITY)
Admission: EM | Admit: 2018-09-21 | Discharge: 2018-09-21 | Disposition: A | Discharge status: Home / Self Care | Payer: Medicaid Other | Attending: Emergency Medicine | Admitting: Emergency Medicine

## 2018-09-21 DIAGNOSIS — T50905A Adverse effect of unspecified drugs, medicaments and biological substances, initial encounter: Secondary | ICD-10-CM | POA: Diagnosis not present

## 2018-09-21 DIAGNOSIS — Y929 Unspecified place or not applicable: Secondary | ICD-10-CM | POA: Insufficient documentation

## 2018-09-21 DIAGNOSIS — S61312A Laceration without foreign body of right middle finger with damage to nail, initial encounter: Secondary | ICD-10-CM | POA: Insufficient documentation

## 2018-09-21 DIAGNOSIS — S6991XA Unspecified injury of right wrist, hand and finger(s), initial encounter: Secondary | ICD-10-CM | POA: Diagnosis present

## 2018-09-21 DIAGNOSIS — S61309A Unspecified open wound of unspecified finger with damage to nail, initial encounter: Secondary | ICD-10-CM

## 2018-09-21 DIAGNOSIS — X58XXXA Exposure to other specified factors, initial encounter: Secondary | ICD-10-CM | POA: Insufficient documentation

## 2018-09-21 DIAGNOSIS — I10 Essential (primary) hypertension: Secondary | ICD-10-CM | POA: Diagnosis not present

## 2018-09-21 DIAGNOSIS — F17210 Nicotine dependence, cigarettes, uncomplicated: Secondary | ICD-10-CM | POA: Insufficient documentation

## 2018-09-21 DIAGNOSIS — R21 Rash and other nonspecific skin eruption: Secondary | ICD-10-CM | POA: Insufficient documentation

## 2018-09-21 DIAGNOSIS — J45909 Unspecified asthma, uncomplicated: Secondary | ICD-10-CM | POA: Diagnosis not present

## 2018-09-21 DIAGNOSIS — Y999 Unspecified external cause status: Secondary | ICD-10-CM | POA: Diagnosis not present

## 2018-09-21 DIAGNOSIS — Z79899 Other long term (current) drug therapy: Secondary | ICD-10-CM | POA: Insufficient documentation

## 2018-09-21 DIAGNOSIS — Y939 Activity, unspecified: Secondary | ICD-10-CM | POA: Insufficient documentation

## 2018-09-21 MED ORDER — FAMOTIDINE 20 MG PO TABS: 20.0000 mg | ORAL_TABLET | Freq: Two times a day (BID) | ORAL | 0 refills | Status: AC

## 2018-09-21 MED ORDER — DIPHENHYDRAMINE HCL 25 MG PO CAPS
50.0000 mg | ORAL_CAPSULE | Freq: Once | ORAL | Status: AC
  Administered 2018-09-21: 50 mg via ORAL
  Filled 2018-09-21: qty 2

## 2018-09-21 MED ORDER — DEXAMETHASONE SODIUM PHOSPHATE 10 MG/ML IJ SOLN
10.0000 mg | Freq: Once | INTRAMUSCULAR | Status: AC
  Administered 2018-09-21: 10 mg via INTRAMUSCULAR
  Filled 2018-09-21: qty 1

## 2018-09-21 MED ORDER — FAMOTIDINE 20 MG PO TABS
20.0000 mg | ORAL_TABLET | Freq: Once | ORAL | Status: AC
  Administered 2018-09-21: 20 mg via ORAL
  Filled 2018-09-21: qty 1

## 2018-09-21 MED ORDER — MUPIROCIN CALCIUM 2 % EX CREA: 1.0000 "application " | TOPICAL_CREAM | Freq: Two times a day (BID) | CUTANEOUS | 0 refills | Status: AC

## 2018-09-21 NOTE — ED Triage Notes (Addendum)
Pt reports rash on abdomen and lower legs. He endorses itching. Rash appears to be hives. Pt started sulfa antibiotics antibiotics yesterday. No SOB.

## 2018-09-21 NOTE — ED Notes (Signed)
PT DISCHARGED. INSTRUCTIONS GIVEN. AAOX4. PT IN NO APPARENT DISTRESS WITH MILD PAIN. THE OPPORTUNITY TO ASK QUESTIONS WAS PROVIDED. 

## 2018-09-21 NOTE — ED Provider Notes (Signed)
[facility]-EMERGENCY DEPT Provider Note   CSN: [id number] Arrival date & time: 09/21/18  0000    History   Chief Complaint Chief Complaint  Patient presents with  . Rash    HPI Garrett Thomas is a 63 y.o. male.     The history is provided by the patient.  Rash  Location:  Torso and leg Torso rash location:  Abd LUQ, abd LLQ, abd RLQ and abd RUQ Leg rash location:  L upper leg and R upper leg Quality: itchiness   Quality: not draining, not dry and not painful   Severity:  Moderate Onset quality:  Gradual Timing:  Constant Progression:  Unchanged Chronicity:  New Context: medications   Context comment:  Bactrim Relieved by:  Nothing Worsened by:  Nothing Ineffective treatments:  None tried Associated symptoms: no abdominal pain, no fever, no headaches, no hoarse voice, no induration, no myalgias, no sore throat, no throat swelling, no tongue swelling, not vomiting and not wheezing     Past Medical History:  Diagnosis Date  . ADHD (attention deficit hyperactivity disorder)   . Anxiety   . Arthritis   . Asthma   . Blood transfusion without reported diagnosis    x9  . Depression   . Fibromyalgia   . GERD (gastroesophageal reflux disease)   . Hyperlipidemia   . Hypertension   . ITP (idiopathic thrombocytopenic purpura)   . Lupus (HCC)     There are no active problems to display for this patient.   Past Surgical History:  Procedure Laterality Date  . COLONOSCOPY  2000   [facility] in [place]- they cannot find a report on this per Pt   . dental surgeries          Home Medications    Prior to Admission medications   Medication Sig Start Date End Date Taking? Authorizing Provider  albuterol (PROVENTIL HFA;VENTOLIN HFA) 108 (90 BASE) MCG/ACT inhaler Inhale 2 puffs into the lungs every 4 (four) hours as needed for wheezing.    Yes [provider]  amLODipine (NORVASC) 10 MG tablet Take 10 mg by mouth every morning.     Yes [provider]  amphetamine-dextroamphetamine (ADDERALL) 20 MG tablet Take 20 mg by mouth 3 (three) times daily.  06/01/17  Yes [provider]  atenolol (TENORMIN) 25 MG tablet Take 25 mg by mouth daily.   Yes [provider]  busPIRone (BUSPAR) 15 MG tablet Take 15 mg by mouth 2 (two) times daily.   Yes [provider]  cloNIDine (CATAPRES) 0.1 MG tablet Take 0.1 mg by mouth 3 (three) times daily.   Yes [provider]  cyclobenzaprine (FLEXERIL) 10 MG tablet Take 10 mg by mouth at bedtime as needed for muscle spasms.  05/23/17  Yes [provider]  HYDROcodone-acetaminophen (NORCO) 10-325 MG tablet Take 1 tablet by mouth 3 (three) times daily as needed (pain).  04/10/17  Yes [provider]  hydrocortisone cream 1 % Apply 1 application topically 3 (three) times daily as needed for itching.   Yes [provider]  levothyroxine (SYNTHROID, LEVOTHROID) 50 MCG tablet Take 50 mcg by mouth at bedtime.   Yes [provider]  lovastatin (MEVACOR) 20 MG tablet Take 20 mg by mouth at bedtime.   Yes [provider]  meloxicam (MOBIC) 15 MG tablet Take 15 mg by mouth every morning.    Yes [provider]  montelukast (SINGULAIR) 10 MG tablet Take 10 mg by mouth at  bedtime.   Yes [provider]  omeprazole (PRILOSEC) 20 MG capsule Take 20 mg by mouth daily. 05/23/17  Yes [provider]  OVER THE COUNTER MEDICATION Take 2 tablets by mouth every other day. Nugentix 2 tabs daily    Yes [provider]  sertraline (ZOLOFT) 100 MG tablet Take 100 mg by mouth daily.  06/01/17  Yes [provider]  sulfamethoxazole-trimethoprim (BACTRIM DS,SEPTRA DS) 800-160 MG tablet Take 1 tablet by mouth 2 (two) times daily. 09/15/18  Yes Cheron Schaumann K, PA-C  venlafaxine (EFFEXOR) 37.5 MG tablet Take 37.5 mg by mouth 2 (two) times daily.   Yes [provider]  doxycycline  (VIBRAMYCIN) 100 MG capsule Take 1 capsule (100 mg total) by mouth 2 (two) times daily. One po bid x 7 days Patient not taking: Reported on 07/16/2017 12/23/13   Arthor Captain, PA-C    Family History Family History  Problem Relation Age of Onset  . Hypertension Other   . Diabetes Other   . Cancer Other   . CAD Other   . Colon polyps Brother   . Crohn's disease Other   . Colon cancer Neg Hx   . Esophageal cancer Neg Hx   . Rectal cancer Neg Hx   . Stomach cancer Neg Hx     Social History Social History   Tobacco Use  . Smoking status: Current Every Day Smoker    Packs/day: 1.00    Types: Cigarettes  . Smokeless tobacco: Never Used  Substance Use Topics  . Alcohol use: No  . Drug use: No     Allergies   Bactrim [sulfamethoxazole-trimethoprim]; Demerol [meperidine hcl]; Dilaudid [hydromorphone hcl]; and Prednisone   Review of Systems Review of Systems  Constitutional: Negative for fever.  HENT: Negative for drooling, facial swelling, hoarse voice and sore throat.   Respiratory: Negative for wheezing.   Gastrointestinal: Negative for abdominal pain and vomiting.  Musculoskeletal: Negative for myalgias.  Skin: Positive for rash.  Neurological: Negative for headaches.  All other systems reviewed and are negative.    Physical Exam Updated Vital Signs BP (!) 160/54 (BP Location: Right Arm)   Pulse 88   Temp 98 F (36.7 C) (Oral)   Resp 18   SpO2 97%   Physical Exam Vitals signs and nursing note reviewed.  Constitutional:      General: He is not in acute distress.    Appearance: He is obese.  HENT:     Head: Normocephalic and atraumatic.     Comments: No swelling of the lips tongue or uvula    Nose: Nose normal.     Mouth/Throat:     Mouth: Mucous membranes are moist.     Pharynx: Oropharynx is clear.  Eyes:     Conjunctiva/sclera: Conjunctivae normal.     Pupils: Pupils are equal, round, and reactive to light.  Neck:     Musculoskeletal: Normal  range of motion and neck supple.  Cardiovascular:     Rate and Rhythm: Normal rate and regular rhythm.     Pulses: Normal pulses.     Heart sounds: Normal heart sounds.  Pulmonary:     Effort: Pulmonary effort is normal. No respiratory distress.     Breath sounds: Normal breath sounds. No wheezing or rhonchi.  Abdominal:     General: Abdomen is flat. Bowel sounds are normal.     Tenderness: There is no abdominal tenderness.  Skin:    General: Skin is warm and dry.  Capillary Refill: Capillary refill takes less than 2 seconds.     Findings: Rash present.     Comments: Papular eruption on the abdomen and B proximal LE  Consistent with drug eruption  Neurological:     General: No focal deficit present.     Mental Status: He is alert.  Psychiatric:        Mood and Affect: Mood normal.        Behavior: Behavior normal.      ED Treatments / Results  Labs (all labs ordered are listed, but only abnormal results are displayed) Labs Reviewed - No data to display  EKG None  Radiology No results found.  Procedures Procedures (including critical care time)  Medications Ordered in ED Medications  dexamethasone (DECADRON) injection 10 mg (10 mg Intramuscular Given 09/21/18 0148)  diphenhydrAMINE (BENADRYL) capsule 50 mg (50 mg Oral Given 09/21/18 0149)  famotidine (PEPCID) tablet 20 mg (20 mg Oral Given 09/21/18 0148)       Final Clinical Impressions(s) / ED Diagnoses   Given patient's allergy to prednisone have given one dose of dexamethasone which will last 3 days.  Will send home on pepcid and have instructed patient to take benadryl every 6 hours for itching.  Disguard antibiotics.  No signs of infection on exam or xray of the finger.  Follow up with hand surgery regarding nail loss etc.  Mupirocin applied to the area of the finger BID.    Return for worsening rash, shortness of breath, swelling of the lips, tongue or the back of the mouth, intractable cough, fevers  >100.4 unrelieved by medication, shortness of breath, intractable vomiting, chest pain, shortness of breath, weakness numbness, changes in speech, facial asymmetry,abdominal pain, passing out,Inability to tolerate liquids or food, cough, altered mental status or any concerns. No signs of systemic illness or infection. The patient is nontoxic-appearing on exam and vital signs are within normal limits.   I have reviewed the triage vital signs and the nursing notes. Pertinent labs &imaging results that were available during my care of the patient were reviewed by me and considered in my medical decision making (see chart for details).  After history, exam, and medical workup I feel the patient has been appropriately medically screened and is safe for discharge home. Pertinent diagnoses were discussed with the patient. Patient was given return precautions.    Isabele Lollar, MD 09/21/18 [phone]

## 2019-02-19 ENCOUNTER — Encounter: Payer: Self-pay | Admitting: Gastroenterology

## 2019-05-13 IMAGING — DX DG HAND COMPLETE 3+V*R*
3 series · 3 of 3 positions shown · non-contrast
Comparison: None.

CLINICAL DATA: 62 y/o M; infection of the tip of right middle
finger.

EXAM:
RIGHT HAND - COMPLETE 3+ VIEW

[hand ap]
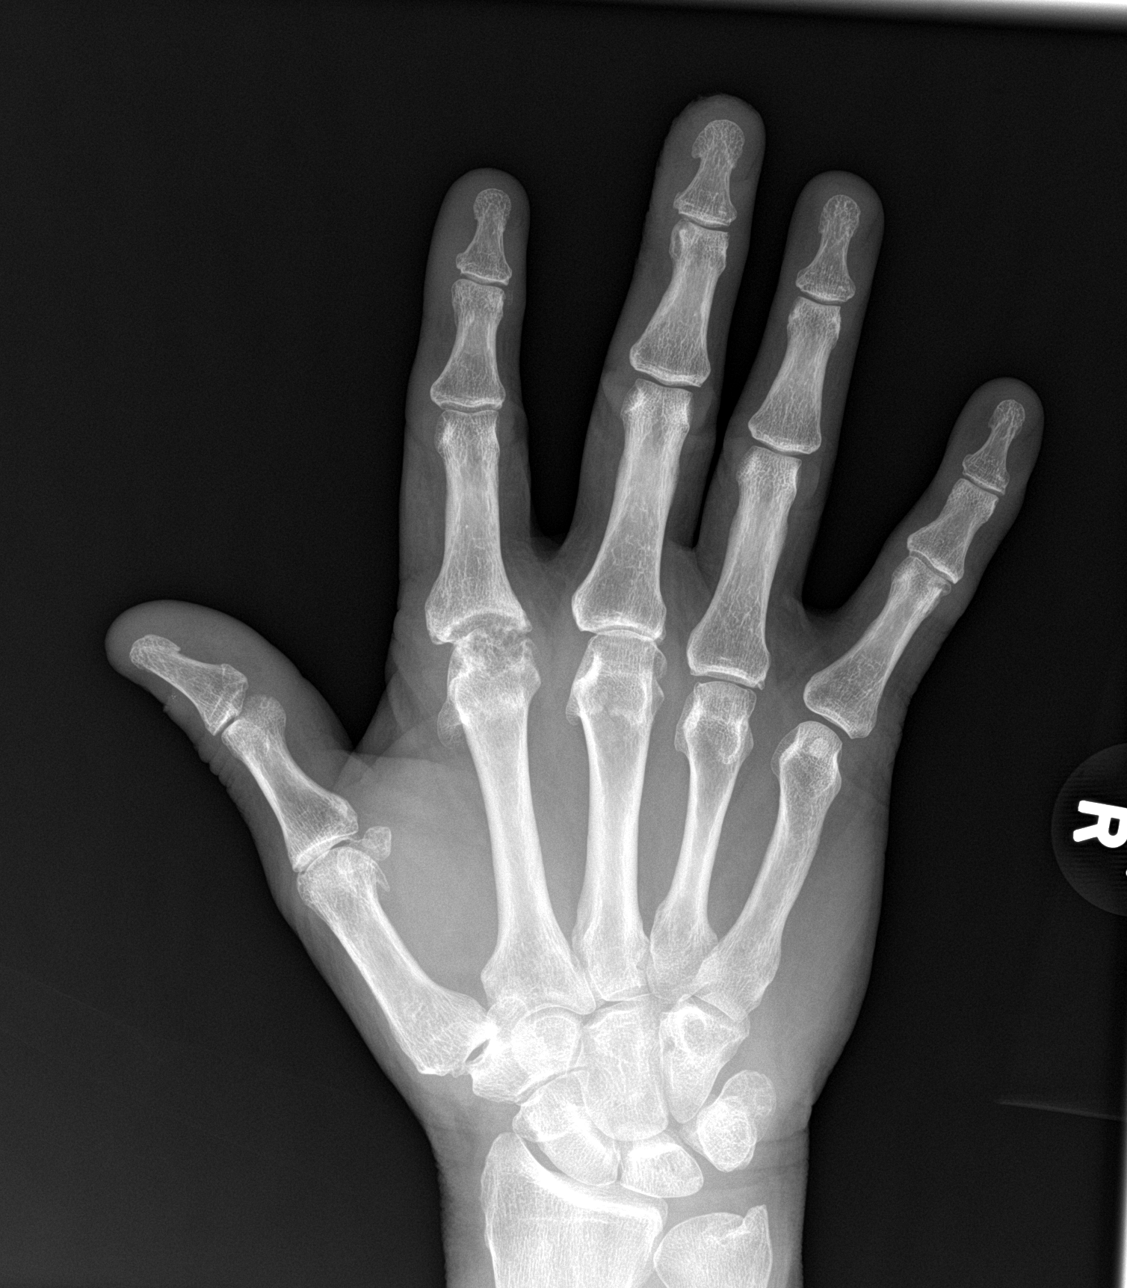

[hand obl]
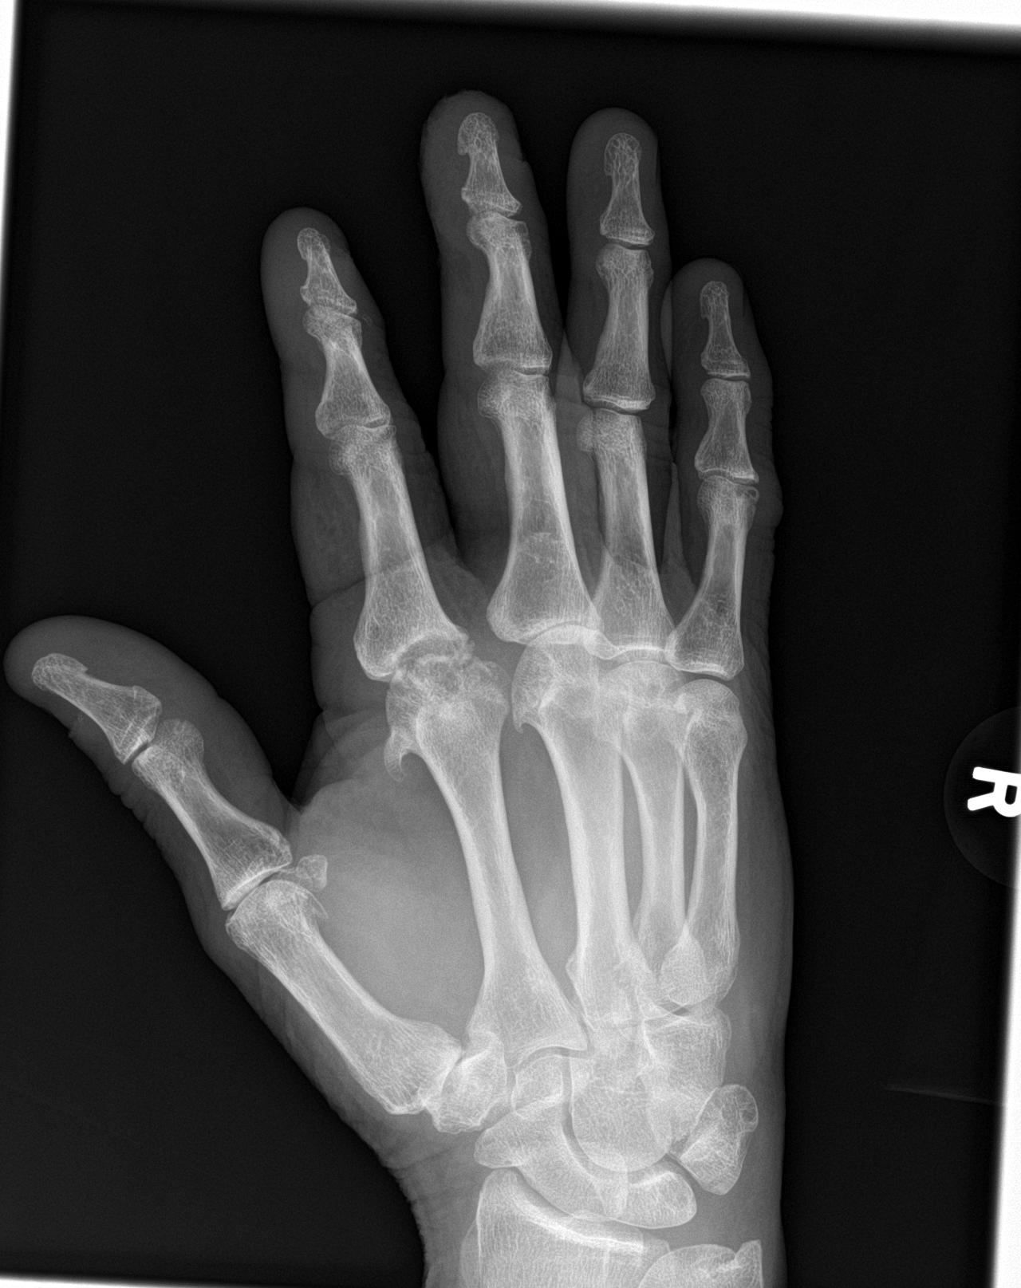

[hand lat]
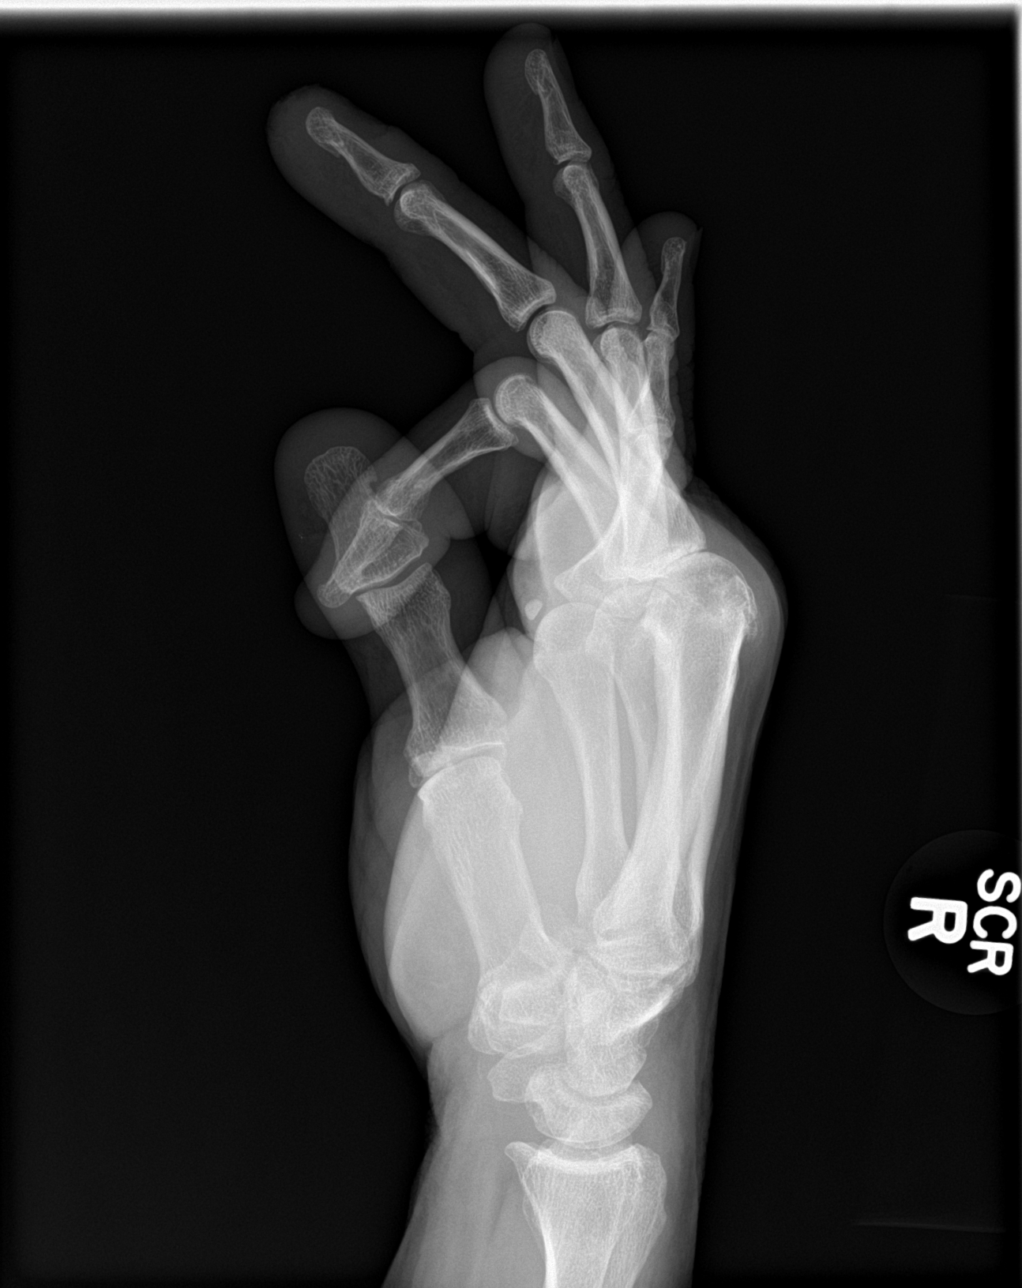

[3 of 3 positions shown; findings below may reference images not displayed]

FINDINGS: No acute fracture or dislocation identified. Soft tissue
irregularity of the tip of third digit. No findings of
osteomyelitis. Nonspecific chronic erosive osteoarthritis of the
second metacarpophalangeal. Overhanging metacarpal head osteophytes
of digits 1-3. Mild loss of the first and third metacarpophalangeal
joint spaces.
IMPRESSION: 1. Soft tissue irregularity of the tip of third digit. No findings
of osteomyelitis.
2. Nonspecific chronic erosive osteoarthritis of second
metacarpophalangeal joint, possibly degenerative, [HOSPITAL] disease,
or inflammatory.
3. Overhanging metacarpal head osteophytes of digits 1-3 can be seen
with hemochromatosis or CPPD. No soft tissue mineralization.

## 2021-03-13 ENCOUNTER — Encounter (HOSPITAL_COMMUNITY): Payer: Self-pay

## 2021-03-13 ENCOUNTER — Emergency Department (HOSPITAL_COMMUNITY)
Admission: EM | Admit: 2021-03-13 | Discharge: 2021-03-13 | Disposition: A | Discharge status: Home / Self Care | Payer: Medicaid Other | Source: Home / Self Care | Attending: Emergency Medicine | Admitting: Emergency Medicine

## 2021-03-13 ENCOUNTER — Emergency Department (HOSPITAL_COMMUNITY)
Admission: EM | Admit: 2021-03-13 | Discharge: 2021-03-13 | Disposition: A | Discharge status: Home / Self Care | Payer: Medicaid Other | Attending: Emergency Medicine | Admitting: Emergency Medicine

## 2021-03-13 ENCOUNTER — Other Ambulatory Visit: Payer: Self-pay

## 2021-03-13 DIAGNOSIS — K0889 Other specified disorders of teeth and supporting structures: Secondary | ICD-10-CM | POA: Diagnosis present

## 2021-03-13 DIAGNOSIS — J45909 Unspecified asthma, uncomplicated: Secondary | ICD-10-CM | POA: Insufficient documentation

## 2021-03-13 DIAGNOSIS — F17210 Nicotine dependence, cigarettes, uncomplicated: Secondary | ICD-10-CM | POA: Insufficient documentation

## 2021-03-13 DIAGNOSIS — Z79899 Other long term (current) drug therapy: Secondary | ICD-10-CM | POA: Insufficient documentation

## 2021-03-13 DIAGNOSIS — I10 Essential (primary) hypertension: Secondary | ICD-10-CM | POA: Insufficient documentation

## 2021-03-13 MED ORDER — AMOXICILLIN-POT CLAVULANATE 875-125 MG PO TABS: 1.0000 | ORAL_TABLET | Freq: Two times a day (BID) | ORAL | 0 refills | Status: AC

## 2021-03-13 MED ORDER — PENICILLIN V POTASSIUM 500 MG PO TABS: 500.0000 mg | ORAL_TABLET | Freq: Four times a day (QID) | ORAL | 0 refills | Status: AC

## 2021-03-13 MED ORDER — PENICILLIN V POTASSIUM 500 MG PO TABS
500.0000 mg | ORAL_TABLET | Freq: Once | ORAL | Status: AC
  Administered 2021-03-13: 500 mg via ORAL
  Filled 2021-03-13: qty 1

## 2021-03-13 MED ORDER — HYDROCODONE-ACETAMINOPHEN 5-325 MG PO TABS: 2.0000 | ORAL_TABLET | ORAL | 0 refills | Status: AC | PRN

## 2021-03-13 MED ORDER — KETOROLAC TROMETHAMINE 30 MG/ML IJ SOLN
30.0000 mg | Freq: Once | INTRAMUSCULAR | Status: AC
Start: 2021-03-13 — End: 2021-03-13
  Administered 2021-03-13: 30 mg via INTRAMUSCULAR
  Filled 2021-03-13: qty 1

## 2021-03-13 MED ORDER — BUPIVACAINE-EPINEPHRINE (PF) 0.5% -1:200000 IJ SOLN: 10.0000 mL | Freq: Once | INTRAMUSCULAR | Status: DC

## 2021-03-13 MED ORDER — BUPIVACAINE-EPINEPHRINE (PF) 0.5% -1:200000 IJ SOLN
10.0000 mL | Freq: Once | INTRAMUSCULAR | Status: AC
  Administered 2021-03-13: 10 mL
  Filled 2021-03-13: qty 10
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
## 2021-03-13 NOTE — ED Triage Notes (Signed)
Pt complains of dental pain to the right side of his mouth. Pt states that he has multiple abscesses.

## 2021-03-13 NOTE — ED Notes (Signed)
PA-C at the bedside.  ?

## 2021-03-13 NOTE — ED Provider Notes (Signed)
[facility]-EMERGENCY DEPT Provider Note   CSN: [id number] Arrival date & time: 03/13/21  0131     History Chief Complaint  Patient presents with   Dental Pain    Garrett Thomas is a 65 y.o. male.  Patient presents to the emergency department with a chief complaint of right upper dental pain.  States that his symptoms started about a week ago.  States that he has been here in the hospital staying with his wife who is admitted.  States that the pain has been progressively worsening over the past week.  Denies any successful treatments prior to arrival.  He states that he does not have a dentist.  The history is provided by the patient. No language interpreter was used.      Past Medical History:  Diagnosis Date   ADHD (attention deficit hyperactivity disorder)    Anxiety    Arthritis    Asthma    Blood transfusion without reported diagnosis    x9   Depression    Fibromyalgia    GERD (gastroesophageal reflux disease)    Hyperlipidemia    Hypertension    ITP (idiopathic thrombocytopenic purpura)    Lupus (HCC)     There are no problems to display for this patient.   Past Surgical History:  Procedure Laterality Date   COLONOSCOPY  2000   [facility] in [place]- they cannot find a report on this per Pt    dental surgeries         Family History  Problem Relation Age of Onset   Hypertension Other    Diabetes Other    Cancer Other    CAD Other    Colon polyps Brother    Crohn's disease Other    Colon cancer Neg Hx    Esophageal cancer Neg Hx    Rectal cancer Neg Hx    Stomach cancer Neg Hx     Social History   Tobacco Use   Smoking status: Every Day    Packs/day: 1.00    Types: Cigarettes   Smokeless tobacco: Never  Substance Use Topics   Alcohol use: No   Drug use: No    Home Medications Prior to Admission medications   Medication Sig Start Date End Date Taking? Authorizing Provider  albuterol (PROVENTIL HFA;VENTOLIN  HFA) 108 (90 BASE) MCG/ACT inhaler Inhale 2 puffs into the lungs every 4 (four) hours as needed for wheezing.     [provider]  amLODipine (NORVASC) 10 MG tablet Take 10 mg by mouth every morning.     [provider]  amphetamine-dextroamphetamine (ADDERALL) 20 MG tablet Take 20 mg by mouth 3 (three) times daily.  06/01/17   [provider]  atenolol (TENORMIN) 25 MG tablet Take 25 mg by mouth daily.    [provider]  busPIRone (BUSPAR) 15 MG tablet Take 15 mg by mouth 2 (two) times daily.    [provider]  cloNIDine (CATAPRES) 0.1 MG tablet Take 0.1 mg by mouth 3 (three) times daily.    [provider]  cyclobenzaprine (FLEXERIL) 10 MG tablet Take 10 mg by mouth at bedtime as needed for muscle spasms.  05/23/17   [provider]  doxycycline (VIBRAMYCIN) 100 MG capsule Take 1 capsule (100 mg total) by mouth 2 (two) times daily. One po bid x 7 days Patient not taking: Reported on 07/16/2017 12/23/13   Arthor Captain, PA-C  famotidine (PEPCID) 20 MG tablet Take 1 tablet (20 mg total)  by mouth 2 (two) times daily. 09/21/18   Palumbo, April, MD  HYDROcodone-acetaminophen (NORCO) 10-325 MG tablet Take 1 tablet by mouth 3 (three) times daily as needed (pain).  04/10/17   [provider]  hydrocortisone cream 1 % Apply 1 application topically 3 (three) times daily as needed for itching.    [provider]  levothyroxine (SYNTHROID, LEVOTHROID) 50 MCG tablet Take 50 mcg by mouth at bedtime.    [provider]  lovastatin (MEVACOR) 20 MG tablet Take 20 mg by mouth at bedtime.    [provider]  meloxicam (MOBIC) 15 MG tablet Take 15 mg by mouth every morning.     [provider]  montelukast (SINGULAIR) 10 MG tablet Take 10 mg by mouth at bedtime.    [provider]  mupirocin cream (BACTROBAN) 2 % Apply 1 application topically 2 (two) times daily. 09/21/18   Palumbo, April, MD   omeprazole (PRILOSEC) 20 MG capsule Take 20 mg by mouth daily. 05/23/17   [provider]  OVER THE COUNTER MEDICATION Take 2 tablets by mouth every other day. Nugentix 2 tabs daily     [provider]  sertraline (ZOLOFT) 100 MG tablet Take 100 mg by mouth daily.  06/01/17   [provider]  sulfamethoxazole-trimethoprim (BACTRIM DS,SEPTRA DS) 800-160 MG tablet Take 1 tablet by mouth 2 (two) times daily. 09/15/18   Elson Areas, PA-C  venlafaxine (EFFEXOR) 37.5 MG tablet Take 37.5 mg by mouth 2 (two) times daily.    [provider]    Allergies    Bactrim [sulfamethoxazole-trimethoprim], Demerol [meperidine hcl], Dilaudid [hydromorphone hcl], and Prednisone  Review of Systems   Review of Systems  Constitutional:  Negative for chills and fever.  HENT:  Positive for dental problem. Negative for drooling.   Neurological:  Negative for speech difficulty.  Psychiatric/Behavioral:  Positive for sleep disturbance.    Physical Exam Updated Vital Signs There were no vitals taken for this visit.  Physical Exam Physical Exam  Constitutional: Pt appears well-developed and well-nourished.  HENT:  Head: Normocephalic.  Mouth/Throat: Uvula is midline, oropharynx is clear and moist and mucous membranes are normal. No oral lesions. No uvula swelling or lacerations. No oropharyngeal exudate, posterior oropharyngeal edema, posterior oropharyngeal erythema or tonsillar abscesses.  Poor dentition No gingival swelling, fluctuance or induration No gross abscess  No sublingual edema, tenderness to palpation, or sign of Ludwig's angina, or deep space infection Pain at right upper rear molars Eyes: Conjunctivae are normal. Pupils are equal, round, and reactive to light. Right eye exhibits no discharge. Left eye exhibits no discharge.  Neck: Normal range of motion. Neck supple.  No stridor Handling secretions without difficulty No nuchal rigidity No cervical  lymphadenopathy Cardiovascular: Normal rate  Pulmonary/Chest: Effort normal. No respiratory distress.  Equal chest rise  Neurological: Pt is alert and oriented x 4  Skin: Skin is warm and dry.  Psychiatric: Pt has a normal mood and affect.  Nursing note and vitals reviewed.  ED Results / Procedures / Treatments   Labs (all labs ordered are listed, but only abnormal results are displayed) Labs Reviewed - No data to display  EKG None  Radiology No results found.  Procedures Procedures   Medications Ordered in ED Medications  ketorolac (TORADOL) 30 MG/ML injection 30 mg (has no administration in time range)  penicillin v potassium (VEETID) tablet 500 mg (has no administration in time range)    ED Course  I have reviewed the triage  vital signs and the nursing notes.  Pertinent labs & imaging results that were available during my care of the patient were reviewed by me and considered in my medical decision making (see chart for details).    MDM Rules/Calculators/A&P                           Patient with dentalgia.  No abscess requiring immediate incision and drainage.  Exam not concerning for Ludwig's angina or pharyngeal abscess.  Will treat with penicillin. Pt instructed to follow-up with dentist.  Discussed return precautions. Pt safe for discharge.  Final Clinical Impression(s) / ED Diagnoses Final diagnoses:  Pain, dental    Rx / DC Orders ED Discharge Orders          Ordered    penicillin v potassium (VEETID) 500 MG tablet  4 times daily        03/13/21 0304             Roxy Horseman, PA-C 03/13/21 0335    Sabas Sous, MD 03/13/21 [phone]

## 2021-03-13 NOTE — ED Triage Notes (Signed)
Pt presents to the ED for dental pain. Pt checked into the ED earlier this morning for same, however, he left before treatment.
